# Patient Record
Sex: Female | Born: 1985 | Race: Black or African American | Hispanic: No | Marital: Single
Health system: Southern US, Academic
[De-identification: ages and names within clinical notes are randomized; demographics above are authoritative.]

## PROBLEM LIST (undated history)

## (undated) ENCOUNTER — Encounter: Attending: Obesity Medicine | Primary: Obesity Medicine

## (undated) ENCOUNTER — Ambulatory Visit
Payer: PRIVATE HEALTH INSURANCE | Attending: Student in an Organized Health Care Education/Training Program | Primary: Student in an Organized Health Care Education/Training Program

## (undated) ENCOUNTER — Encounter

## (undated) ENCOUNTER — Ambulatory Visit: Payer: PRIVATE HEALTH INSURANCE

## (undated) ENCOUNTER — Telehealth
Attending: Student in an Organized Health Care Education/Training Program | Primary: Student in an Organized Health Care Education/Training Program

## (undated) ENCOUNTER — Ambulatory Visit

## (undated) ENCOUNTER — Ambulatory Visit: Attending: Clinical | Primary: Clinical

## (undated) ENCOUNTER — Ambulatory Visit: Payer: PRIVATE HEALTH INSURANCE | Attending: Obstetrics & Gynecology | Primary: Obstetrics & Gynecology

## (undated) ENCOUNTER — Ambulatory Visit: Payer: PRIVATE HEALTH INSURANCE | Attending: Family | Primary: Family

## (undated) ENCOUNTER — Ambulatory Visit: Payer: PRIVATE HEALTH INSURANCE | Attending: Obesity Medicine | Primary: Obesity Medicine

## (undated) ENCOUNTER — Encounter
Attending: Student in an Organized Health Care Education/Training Program | Primary: Student in an Organized Health Care Education/Training Program

## (undated) ENCOUNTER — Telehealth

## (undated) ENCOUNTER — Telehealth: Attending: Clinical | Primary: Clinical

## (undated) ENCOUNTER — Encounter: Payer: PRIVATE HEALTH INSURANCE | Attending: Obesity Medicine | Primary: Obesity Medicine

## (undated) ENCOUNTER — Encounter: Attending: Physician Assistant | Primary: Physician Assistant

## (undated) ENCOUNTER — Ambulatory Visit: Payer: BLUE CROSS/BLUE SHIELD

## (undated) ENCOUNTER — Ambulatory Visit: Attending: Obstetrics & Gynecology | Primary: Obstetrics & Gynecology

## (undated) ENCOUNTER — Encounter: Attending: Family | Primary: Family

## (undated) ENCOUNTER — Ambulatory Visit
Attending: Student in an Organized Health Care Education/Training Program | Primary: Student in an Organized Health Care Education/Training Program

## (undated) ENCOUNTER — Ambulatory Visit
Payer: BLUE CROSS/BLUE SHIELD | Attending: Student in an Organized Health Care Education/Training Program | Primary: Student in an Organized Health Care Education/Training Program

## (undated) ENCOUNTER — Telehealth: Attending: Family | Primary: Family

## (undated) ENCOUNTER — Ambulatory Visit: Payer: BLUE CROSS/BLUE SHIELD | Attending: Physician Assistant | Primary: Physician Assistant

## (undated) ENCOUNTER — Encounter: Attending: Obstetrics & Gynecology | Primary: Obstetrics & Gynecology

## (undated) ENCOUNTER — Ambulatory Visit: Payer: PRIVATE HEALTH INSURANCE | Attending: Registered" | Primary: Registered"

## (undated) ENCOUNTER — Ambulatory Visit
Payer: PRIVATE HEALTH INSURANCE | Attending: Rehabilitative and Restorative Service Providers" | Primary: Rehabilitative and Restorative Service Providers"

## (undated) ENCOUNTER — Encounter: Attending: Internal Medicine | Primary: Internal Medicine

## (undated) ENCOUNTER — Encounter: Attending: Clinical | Primary: Clinical

## (undated) ENCOUNTER — Telehealth: Attending: Obstetrics & Gynecology | Primary: Obstetrics & Gynecology

## (undated) ENCOUNTER — Ambulatory Visit: Payer: PRIVATE HEALTH INSURANCE | Attending: Internal Medicine | Primary: Internal Medicine

## (undated) ENCOUNTER — Encounter: Attending: Family Medicine | Primary: Family Medicine

## (undated) ENCOUNTER — Encounter: Attending: Women's Health | Primary: Women's Health

## (undated) ENCOUNTER — Ambulatory Visit: Payer: PRIVATE HEALTH INSURANCE | Attending: Family Medicine | Primary: Family Medicine

## (undated) ENCOUNTER — Ambulatory Visit: Attending: Pharmacist | Primary: Pharmacist

## (undated) ENCOUNTER — Ambulatory Visit: Payer: PRIVATE HEALTH INSURANCE | Attending: Psychiatric/Mental Health | Primary: Psychiatric/Mental Health

---
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
## 2006-03-29 ENCOUNTER — Emergency Department (HOSPITAL_COMMUNITY): Admission: EM | Admit: 2006-03-29 | Discharge: 2006-03-29 | Payer: Self-pay | Admitting: Emergency Medicine

## 2006-06-29 ENCOUNTER — Emergency Department (HOSPITAL_COMMUNITY): Admission: EM | Admit: 2006-06-29 | Discharge: 2006-06-29 | Payer: Self-pay | Admitting: Emergency Medicine

## 2016-12-04 ENCOUNTER — Emergency Department
Admission: EM | Admit: 2016-12-04 | Discharge: 2016-12-04 | Disposition: A | Discharge status: Home / Self Care | Payer: Commercial Managed Care - PPO | Attending: Emergency Medicine | Admitting: Emergency Medicine

## 2016-12-04 ENCOUNTER — Encounter: Payer: Self-pay | Admitting: Emergency Medicine

## 2016-12-04 DIAGNOSIS — K5900 Constipation, unspecified: Secondary | ICD-10-CM

## 2016-12-04 DIAGNOSIS — O9989 Other specified diseases and conditions complicating pregnancy, childbirth and the puerperium: Secondary | ICD-10-CM | POA: Insufficient documentation

## 2016-12-04 DIAGNOSIS — Z3A14 14 weeks gestation of pregnancy: Secondary | ICD-10-CM | POA: Insufficient documentation

## 2016-12-04 NOTE — ED Provider Notes (Signed)
[facility] Emergency Department Provider Note ____________________________________________   I have reviewed the triage vital signs and the triage nursing note.  HISTORY  Chief Complaint Back Pain and Constipation   Historian Patient  HPI Yvonne Frank is a 31 y.o. female G2p3 approx [redacted] weeks pregnant is presenting with complaint of constipation. She states that when she was pertinent with her first child she had constipation so bad in her seventh month that she required rectal disimpaction and wanted to go ahead and come in early before it that becomes a serious issue with this pregnancy. Denies abdominal pain. Denies any vaginal bleeding or vaginal discharge. She states it feels like something is low down in her rectum. She's had no black or bloody stool. She states that she's had no nausea or vomiting. She has been eating apples. She states that she has tried 2 days of taking MiraLAX.  Discomfort is mild-moderate.    History reviewed. No pertinent past medical history.  There are no active problems to display for this patient.   History reviewed. No pertinent surgical history.  Prior to Admission medications   Not on File    No Known Allergies  No family history on file.  Social History Social History  Substance Use Topics  . Smoking status: Never Smoker  . Smokeless tobacco: Not on file  . Alcohol use Not on file    Review of Systems  Constitutional: Negative for fever. Eyes: Negative for visual changes. ENT: Negative for sore throat. Cardiovascular: Negative for chest pain. Respiratory: Negative for shortness of breath. Gastrointestinal: Negative for abdominal pain, vomiting and diarrhea. Genitourinary: Negative for dysuria. Musculoskeletal: Negative for back pain. Skin: Negative for rash. Neurological: Negative for headache.  ____________________________________________   PHYSICAL EXAM:  VITAL SIGNS: ED Triage Vitals   Enc Vitals Group     BP 12/04/16 0922 130/90     Pulse Rate 12/04/16 0922 84     Resp 12/04/16 0922 18     Temp 12/04/16 0922 98.2 F (36.8 C)     Temp Source 12/04/16 0922 Oral     SpO2 12/04/16 0922 97 %     Weight 12/04/16 0923 180 lb (81.6 kg)     Height 12/04/16 0923 5\' 2"  (1.575 m)     Head Circumference --      Peak Flow --      Pain Score 12/04/16 0922 6     Pain Loc --      Pain Edu? --      Excl. in GC? --      Constitutional: Alert and oriented. Well appearing and in no distress. HEENT   Head: Normocephalic and atraumatic.      Eyes: Conjunctivae are normal. Pupils equal and round.       Ears:         Nose: No congestion/rhinnorhea.   Mouth/Throat: Mucous membranes are moist.   Neck: No stridor. Cardiovascular/Chest: Normal rate, regular rhythm.  No murmurs, rubs, or gallops. Respiratory: Normal respiratory effort without tachypnea nor retractions. Breath sounds are clear and equal bilaterally. No wheezes/rales/rhonchi. Gastrointestinal: Soft. No distention, no guarding, no rebound. Nontender.    Genitourinary/rectal: No external hemorrhoids.  Hard stool at fingertip for manual disimpaction evaluation Musculoskeletal: Nontender with normal range of motion in all extremities. No joint effusions.  No lower extremity tenderness.  No edema. Neurologic:  Normal speech and language. No gross or focal neurologic deficits are appreciated. Skin:  Skin is warm, dry and intact. No rash  noted. Psychiatric: Mood and affect are normal. Speech and behavior are normal. Patient exhibits appropriate insight and judgment.   ____________________________________________  LABS (pertinent positives/negatives)  Labs Reviewed - No data to display  ____________________________________________    EKG I, Governor Rooks, MD, the attending physician have personally viewed and interpreted all ECGs.  None ____________________________________________  RADIOLOGY All Xrays were  viewed by me. Imaging interpreted by Radiologist.  None __________________________________________  PROCEDURES  Procedure(s) performed: None  Critical Care performed: None  ____________________________________________   ED COURSE / ASSESSMENT AND PLAN  Pertinent labs & imaging results that were available during my care of the patient were reviewed by me and considered in my medical decision making (see chart for details).    We reviewed adequate hydration, trying docusate rather than MiraLAX, and then also try a suppository at home such as glycerin. On rectal exam here, hard stools at fingertip, unable to really disimpact any volume here in the ED.  No abdominal pain and patient declined pelvic exam which is I think is reasonable given that she's not having any pelvic complaints.    CONSULTATIONS:   None   Patient / Family / Caregiver informed of clinical course, medical decision-making process, and agree with plan.   I discussed return precautions, follow-up instructions, and discharge instructions with patient and/or family.  Discharge Instructions: You were evaluated for constipation related to pregnancy.  Continue to stay well hydrated, increased fiber in your diet, hold off on MiraLAX, take over the counter docusate (Colace) stool softener and you may also try over-the-counter glycerin suppository.  Return to the emergency department immediately for any worsening, patient, black or bloody stools, nausea or vomiting, abdominal pain, or any other symptoms concerning to you.  ___________________________________________   FINAL CLINICAL IMPRESSION(S) / ED DIAGNOSES   Final diagnoses:  Constipation, unspecified constipation type              Note: This dictation was prepared with Dragon dictation. Any transcriptional errors that result from this process are unintentional    Governor Rooks, MD 12/04/16 1218

## 2016-12-04 NOTE — ED Triage Notes (Signed)
Low back pain x 2 days. States is [redacted] weeks pregnant, has not had bowel movement for day, cannot states since when. Denies vag bleed, fluid or contractions, denies abd pain at present.

## 2016-12-04 NOTE — Discharge Instructions (Signed)
You were evaluated for constipation related to pregnancy.  Continue to stay well hydrated, increased fiber in your diet, hold off on MiraLAX, take over the counter docusate (Colace) stool softener and you may also try over-the-counter glycerin suppository.  Return to the emergency department immediately for any worsening, patient, black or bloody stools, nausea or vomiting, abdominal pain, or any other symptoms concerning to you.

## 2016-12-04 NOTE — ED Notes (Signed)
Pt educated on follow up and over the counter medication use. Pt verbalized understanding and was able to ambulate independently without difficulty. NAD noted at time of discharge

## 2016-12-29 ENCOUNTER — Ambulatory Visit
Admission: RE | Admit: 2016-12-29 | Discharge: 2016-12-29 | Disposition: A | Discharge status: Home / Self Care | Payer: Commercial Managed Care - PPO

## 2016-12-29 ENCOUNTER — Ambulatory Visit: Admission: RE | Admit: 2016-12-29 | Discharge: 2016-12-29 | Disposition: A | Discharge status: Home / Self Care

## 2016-12-29 DIAGNOSIS — Z98891 History of uterine scar from previous surgery: Secondary | ICD-10-CM

## 2016-12-29 DIAGNOSIS — O09292 Supervision of pregnancy with other poor reproductive or obstetric history, second trimester: Secondary | ICD-10-CM

## 2016-12-29 DIAGNOSIS — O99212 Obesity complicating pregnancy, second trimester: Secondary | ICD-10-CM

## 2016-12-29 DIAGNOSIS — Z8751 Personal history of pre-term labor: Secondary | ICD-10-CM

## 2016-12-29 DIAGNOSIS — O0942 Supervision of pregnancy with grand multiparity, second trimester: Principal | ICD-10-CM

## 2016-12-29 MED ORDER — ASPIRIN 81 MG TABLET,DELAYED RELEASE
ORAL_TABLET | Freq: Every day | ORAL | 2 refills | 0.00000 days | Status: CP
Start: 2016-12-29 — End: 2017-05-22

## 2017-01-04 ENCOUNTER — Ambulatory Visit: Admit: 2017-01-04 | Discharge: 2017-01-04 | Payer: Commercial Managed Care - PPO

## 2017-01-04 DIAGNOSIS — Z3482 Encounter for supervision of other normal pregnancy, second trimester: Principal | ICD-10-CM

## 2017-01-26 ENCOUNTER — Ambulatory Visit
Admission: RE | Admit: 2017-01-26 | Discharge: 2017-01-26 | Attending: Nurse Practitioner | Admitting: Nurse Practitioner

## 2017-01-26 DIAGNOSIS — Z98891 History of uterine scar from previous surgery: Secondary | ICD-10-CM

## 2017-01-26 DIAGNOSIS — O0942 Supervision of pregnancy with grand multiparity, second trimester: Principal | ICD-10-CM

## 2017-01-26 DIAGNOSIS — O09292 Supervision of pregnancy with other poor reproductive or obstetric history, second trimester: Secondary | ICD-10-CM

## 2017-01-26 DIAGNOSIS — O99212 Obesity complicating pregnancy, second trimester: Secondary | ICD-10-CM

## 2017-02-23 ENCOUNTER — Ambulatory Visit: Admission: RE | Admit: 2017-02-23 | Discharge: 2017-02-23 | Disposition: A | Discharge status: Home / Self Care

## 2017-02-23 DIAGNOSIS — O10919 Unspecified pre-existing hypertension complicating pregnancy, unspecified trimester: Secondary | ICD-10-CM

## 2017-02-23 DIAGNOSIS — O99213 Obesity complicating pregnancy, third trimester: Secondary | ICD-10-CM

## 2017-02-23 DIAGNOSIS — Z98891 History of uterine scar from previous surgery: Secondary | ICD-10-CM

## 2017-02-23 DIAGNOSIS — O0943 Supervision of pregnancy with grand multiparity, third trimester: Principal | ICD-10-CM

## 2017-03-05 ENCOUNTER — Emergency Department
Admission: EM | Admit: 2017-03-05 | Discharge: 2017-03-05 | Disposition: A | Discharge status: Home / Self Care | Payer: Commercial Managed Care - PPO | Attending: Emergency Medicine | Admitting: Emergency Medicine

## 2017-03-05 ENCOUNTER — Encounter: Payer: Self-pay | Admitting: Emergency Medicine

## 2017-03-05 DIAGNOSIS — Z3A00 Weeks of gestation of pregnancy not specified: Secondary | ICD-10-CM | POA: Diagnosis not present

## 2017-03-05 DIAGNOSIS — O99719 Diseases of the skin and subcutaneous tissue complicating pregnancy, unspecified trimester: Secondary | ICD-10-CM | POA: Insufficient documentation

## 2017-03-05 DIAGNOSIS — L739 Follicular disorder, unspecified: Secondary | ICD-10-CM

## 2017-03-05 MED ORDER — PENTAFLUOROPROP-TETRAFLUOROETH EX AERO
INHALATION_SPRAY | Freq: Once | CUTANEOUS | Status: DC
  Filled 2017-03-05: qty 30

## 2017-03-05 MED ORDER — BACITRACIN ZINC 500 UNIT/GM EX OINT
TOPICAL_OINTMENT | CUTANEOUS | Status: AC
  Filled 2017-03-05: qty 0.9

## 2017-03-05 MED ORDER — BACITRACIN ZINC 500 UNIT/GM EX OINT: TOPICAL_OINTMENT | Freq: Two times a day (BID) | CUTANEOUS | Status: DC

## 2017-03-05 NOTE — ED Provider Notes (Signed)
[facility] Emergency Department Provider Note   ____________________________________________   I have reviewed the triage vital signs and the nursing notes.   HISTORY  Chief Complaint Abscess    HPI Yvonne Frank is a 31 y.o. female Presents to the emergency department with small raised bump consistent with inflamed follicle that developed several days ago Patient reported expressing the bump noting a small amount of drainage. Afterwards, she noted increased pain and swelling with mild erythema. Patient is currently pregnant and with her slight weight gain she notes her thighs are rubbing together causing increased irritation along the inflamed follicle. Patient came in to have it evaluated to see if there are any options for alleviating measures. Patient denies any other symptoms and denies any issues or complaints regarding her pregnancy at this time.Patient denies fever, chills, headache, vision changes, chest pain, chest tightness, shortness of breath, abdominal pain, nausea and vomiting.  History reviewed. No pertinent past medical history.  There are no active problems to display for this patient.   Past Surgical History:  Procedure Laterality Date  . CESAREAN SECTION      Prior to Admission medications   Not on File    Allergies Patient has no known allergies.  No family history on file.  Social History Social History  Substance Use Topics  . Smoking status: Never Smoker  . Smokeless tobacco: Never Used  . Alcohol use No    Review of Systems Constitutional: Negative for fever/chills Cardiovascular: Denies chest pain. Respiratory: Denies cough. Denies shortness of breath. Skin: Negative for rash. Painful follicle along the right inner thigh. Neurological: Negative for headaches.  Negative focal weakness or numbness. Negative for loss of consciousness. Able to ambulate. ____________________________________________   PHYSICAL  EXAM:  VITAL SIGNS: ED Triage Vitals  Enc Vitals Group     BP 03/05/17 0823 136/67     Pulse Rate 03/05/17 0823 96     Resp 03/05/17 0823 20     Temp 03/05/17 0823 98.1 F (36.7 C)     Temp Source 03/05/17 0823 Oral     SpO2 03/05/17 0823 98 %     Weight 03/05/17 0824 197 lb (89.4 kg)     Height 03/05/17 0824  (1.575 m)     Head Circumference --      Peak Flow --      Pain Score 03/05/17 0823 6     Pain Loc --      Pain Edu? --      Excl. in GC? --     Constitutional: Alert and oriented. Well appearing and in no acute distress.  Eyes: Conjunctivae are normal. PERRL. EOMI  Head: Normocephalic and atraumatic. Cardiovascular: Normal rate, regular rhythm. Good peripheral circulation. Respiratory: Normal respiratory effort without tachypnea or retractions.  Cardiovascular: Normal rate, regular rhythm. Normal distal pulses. Neurologic: Normal speech and language.  Skin:  Skin is warm, dry and intact. No rash noted. Follicle with erythema and induration without drainage on appearing infected or abscess. Psychiatric: Mood and affect are normal. Speech and behavior are normal. Patient exhibits appropriate insight and judgement.  ____________________________________________   LABS (all labs ordered are listed, but only abnormal results are displayed)  Labs Reviewed - No data to display ____________________________________________  EKG none ____________________________________________  RADIOLOGY none ____________________________________________   PROCEDURES  Procedure(s) performed: no    Critical Care performed: no ____________________________________________   INITIAL IMPRESSION / ASSESSMENT AND PLAN / ED COURSE  Pertinent labs & imaging results that  were available during my care of the patient were reviewed by me and considered in my medical decision making (see chart for details).   Patient presents to emergency department with inflamed follicle along the  right inner thigh. History, physical exam findings are reassuring symptoms areconsistent with inflamed follicle secondary to ingrown hair patient expressed prior to emergency department visit. During visit bacitracin applied and covered with a Band-Aid. Advised patient to continue neosporin for 1 or 2 days and monitor for improvement. Patient has a follow-up appointment with her OB  Provider and plans to have her reassessed on Monday. Reassessment of the patient was reassuring. All options were answered.  Patient advised to follow up with PCP as needed or return to the emergency department if symptoms return or worsen. Patient informed of clinical course, understand medical decision-making process, and agree with plan.  ____________________________________________   FINAL CLINICAL IMPRESSION(S) / ED DIAGNOSES  Final diagnoses:  Folliculitis       NEW MEDICATIONS STARTED DURING THIS VISIT:  There are no discharge medications for this patient.    Note:  This document was prepared using Dragon voice recognition software and may include unintentional dictation errors.    Clois Comber, PA-C 03/05/17 1033    Bonnell Placzek, Jordan Likes, PA-C 03/05/17 1035    Governor Rooks, MD 03/05/17 [phone]

## 2017-03-05 NOTE — Discharge Instructions (Signed)
Applied Neosporin or antibiotic ointment as needed until area resolves. Keep covered with a Band-Aid as needed.  You may take Tylenol as needed for pain as directed on the medication bottle.

## 2017-03-05 NOTE — ED Notes (Signed)
Pt to ed with c/o small red area to right inner thigh since Tuesday.  Pt reports pain radiates up and down leg.

## 2017-03-05 NOTE — ED Triage Notes (Signed)
Patient to ER for abscess to right inner thigh. Patient reports noticing "bump" on Tuesday. Drained blood and pus from area, now area is painful and swollen again. Denies fevers.

## 2017-03-07 ENCOUNTER — Ambulatory Visit
Admission: RE | Admit: 2017-03-07 | Discharge: 2017-03-07 | Disposition: A | Discharge status: Home / Self Care | Payer: Commercial Managed Care - PPO

## 2017-03-07 DIAGNOSIS — Z331 Pregnant state, incidental: Principal | ICD-10-CM

## 2017-03-07 DIAGNOSIS — E7439 Other disorders of intestinal carbohydrate absorption: Secondary | ICD-10-CM

## 2017-03-24 ENCOUNTER — Ambulatory Visit
Admission: RE | Admit: 2017-03-24 | Discharge: 2017-03-24 | Payer: Commercial Managed Care - PPO | Attending: Obstetrics & Gynecology | Admitting: Obstetrics & Gynecology

## 2017-03-24 DIAGNOSIS — O0943 Supervision of pregnancy with grand multiparity, third trimester: Principal | ICD-10-CM

## 2017-03-24 DIAGNOSIS — Z98891 History of uterine scar from previous surgery: Secondary | ICD-10-CM

## 2017-04-05 ENCOUNTER — Ambulatory Visit: Admit: 2017-04-05 | Discharge: 2017-04-05 | Payer: Commercial Managed Care - PPO

## 2017-04-05 DIAGNOSIS — O0943 Supervision of pregnancy with grand multiparity, third trimester: Principal | ICD-10-CM

## 2017-04-14 ENCOUNTER — Ambulatory Visit
Admission: RE | Admit: 2017-04-14 | Discharge: 2017-04-14 | Disposition: A | Discharge status: Home / Self Care | Payer: Commercial Managed Care - PPO | Attending: Nurse Practitioner | Admitting: Nurse Practitioner

## 2017-04-14 DIAGNOSIS — O09293 Supervision of pregnancy with other poor reproductive or obstetric history, third trimester: Secondary | ICD-10-CM

## 2017-04-14 DIAGNOSIS — O0943 Supervision of pregnancy with grand multiparity, third trimester: Principal | ICD-10-CM

## 2017-04-14 DIAGNOSIS — L292 Pruritus vulvae: Secondary | ICD-10-CM

## 2017-04-14 DIAGNOSIS — Z98891 History of uterine scar from previous surgery: Secondary | ICD-10-CM

## 2017-04-19 ENCOUNTER — Ambulatory Visit
Admission: RE | Admit: 2017-04-19 | Discharge: 2017-04-19 | Disposition: A | Discharge status: Home / Self Care | Payer: Commercial Managed Care - PPO

## 2017-04-19 DIAGNOSIS — O133 Gestational [pregnancy-induced] hypertension without significant proteinuria, third trimester: Principal | ICD-10-CM

## 2017-05-02 ENCOUNTER — Ambulatory Visit: Admit: 2017-05-02 | Discharge: 2017-05-02 | Payer: Commercial Managed Care - PPO

## 2017-05-02 DIAGNOSIS — O09299 Supervision of pregnancy with other poor reproductive or obstetric history, unspecified trimester: Principal | ICD-10-CM

## 2017-05-03 ENCOUNTER — Ambulatory Visit
Admission: RE | Admit: 2017-05-03 | Discharge: 2017-05-03 | Disposition: A | Discharge status: Home / Self Care | Payer: Commercial Managed Care - PPO | Attending: Obstetrics & Gynecology | Admitting: Obstetrics & Gynecology

## 2017-05-03 DIAGNOSIS — O133 Gestational [pregnancy-induced] hypertension without significant proteinuria, third trimester: Principal | ICD-10-CM

## 2017-05-03 DIAGNOSIS — O10919 Unspecified pre-existing hypertension complicating pregnancy, unspecified trimester: Secondary | ICD-10-CM

## 2017-05-03 DIAGNOSIS — O09293 Supervision of pregnancy with other poor reproductive or obstetric history, third trimester: Secondary | ICD-10-CM

## 2017-05-03 DIAGNOSIS — Z98891 History of uterine scar from previous surgery: Secondary | ICD-10-CM

## 2017-05-03 DIAGNOSIS — O99213 Obesity complicating pregnancy, third trimester: Secondary | ICD-10-CM

## 2017-05-05 ENCOUNTER — Ambulatory Visit: Admission: RE | Admit: 2017-05-05 | Discharge: 2017-05-05 | Payer: Commercial Managed Care - PPO

## 2017-05-10 ENCOUNTER — Ambulatory Visit: Admission: RE | Admit: 2017-05-10 | Discharge: 2017-05-10

## 2017-05-10 DIAGNOSIS — O10919 Unspecified pre-existing hypertension complicating pregnancy, unspecified trimester: Secondary | ICD-10-CM

## 2017-05-10 DIAGNOSIS — Z98891 History of uterine scar from previous surgery: Secondary | ICD-10-CM

## 2017-05-10 DIAGNOSIS — O0943 Supervision of pregnancy with grand multiparity, third trimester: Principal | ICD-10-CM

## 2017-05-10 MED ORDER — FLUCONAZOLE 150 MG TABLET
ORAL_TABLET | Freq: Once | ORAL | 0 refills | 0.00000 days | Status: CP
Start: 2017-05-10 — End: 2017-05-10

## 2017-05-18 ENCOUNTER — Ambulatory Visit
Admission: RE | Admit: 2017-05-18 | Discharge: 2017-05-18 | Disposition: A | Discharge status: Home / Self Care | Payer: Commercial Managed Care - PPO

## 2017-05-18 DIAGNOSIS — Z3490 Encounter for supervision of normal pregnancy, unspecified, unspecified trimester: Principal | ICD-10-CM

## 2017-05-19 ENCOUNTER — Inpatient Hospital Stay
Admission: RE | Admit: 2017-05-19 | Discharge: 2017-05-22 | Disposition: A | Discharge status: Home / Self Care | Attending: Anesthesiology

## 2017-05-19 ENCOUNTER — Inpatient Hospital Stay
Admission: RE | Admit: 2017-05-19 | Discharge: 2017-05-22 | Disposition: A | Discharge status: Home / Self Care | Payer: Commercial Managed Care - PPO | Attending: Anesthesiology | Admitting: Anesthesiology

## 2017-05-19 ENCOUNTER — Inpatient Hospital Stay
Admission: RE | Admit: 2017-05-19 | Discharge: 2017-05-22 | Disposition: A | Discharge status: Home / Self Care | Payer: Commercial Managed Care - PPO | Attending: Obstetrics & Gynecology | Admitting: Obstetrics & Gynecology

## 2017-05-19 DIAGNOSIS — O34219 Maternal care for unspecified type scar from previous cesarean delivery: Principal | ICD-10-CM

## 2017-05-22 MED ORDER — IBUPROFEN 600 MG TABLET
ORAL_TABLET | Freq: Four times a day (QID) | ORAL | 0 refills | 0 days | Status: CP
Start: 2017-05-22 — End: 2017-07-07

## 2017-05-22 MED ORDER — DOCUSATE SODIUM 100 MG CAPSULE
ORAL_CAPSULE | Freq: Two times a day (BID) | ORAL | 0 refills | 0.00000 days | Status: CP | PRN
Start: 2017-05-22 — End: 2017-06-21

## 2017-05-22 MED ORDER — OXYCODONE 5 MG TABLET
ORAL_TABLET | ORAL | 0 refills | 0 days | Status: CP | PRN
Start: 2017-05-22 — End: 2017-05-27

## 2017-05-22 MED ORDER — ACETAMINOPHEN 325 MG TABLET
ORAL_TABLET | Freq: Four times a day (QID) | ORAL | 0 refills | 0.00000 days | Status: CP
Start: 2017-05-22 — End: 2017-07-07

## 2017-05-27 ENCOUNTER — Emergency Department: Payer: Commercial Managed Care - PPO

## 2017-05-27 ENCOUNTER — Other Ambulatory Visit: Payer: Self-pay

## 2017-05-27 ENCOUNTER — Encounter: Payer: Self-pay | Admitting: Emergency Medicine

## 2017-05-27 ENCOUNTER — Emergency Department
Admission: EM | Admit: 2017-05-27 | Discharge: 2017-05-28 | Disposition: A | Discharge status: Home / Self Care | Payer: Commercial Managed Care - PPO | Attending: Emergency Medicine | Admitting: Emergency Medicine

## 2017-05-27 DIAGNOSIS — K5900 Constipation, unspecified: Secondary | ICD-10-CM | POA: Diagnosis not present

## 2017-05-27 DIAGNOSIS — O9989 Other specified diseases and conditions complicating pregnancy, childbirth and the puerperium: Secondary | ICD-10-CM | POA: Diagnosis present

## 2017-05-27 MED ORDER — MAGNESIUM CITRATE PO SOLN
1.0000 | Freq: Once | ORAL | Status: AC
  Administered 2017-05-28: 1 via ORAL
  Filled 2017-05-27: qty 296

## 2017-05-27 NOTE — ED Triage Notes (Signed)
Pt reports 8 days postpartum had c-section and has not had a BM for the past 8 days denies any abdominal discomfort reports tried to have BM yesterday but had discomfort on her incision. Pt denies any other symptom, pt talks in complete sentences no distress noted

## 2017-05-27 NOTE — ED Provider Notes (Signed)
[facility] Emergency Department Provider Note  ____________________________________________   First MD Initiated Contact with Patient 05/27/17 2253     (approximate)  I have reviewed the triage vital signs and the nursing notes.   HISTORY  Chief Complaint Constipation    HPI Yvonne NoeBianca Frank is a 31 y.o. female complains of being constipated, she had a C-section 8 days ago and had not had a bowel movement 3 days prior for it to be a total of 11 days without a bowel movement, she states she does not have any abdominal pain until she is trying to push to have a bowel movement, the pain is at the incision line, she denies fever chills, she denies vomiting, she tried Colace without any relief ,she has been taking narcotic pain medicine  History reviewed. No pertinent past medical history.  There are no active problems to display for this patient.   Past Surgical History:  Procedure Laterality Date  . CESAREAN SECTION      Prior to Admission medications   Not on File    Allergies Patient has no known allergies.  No family history on file.  Social History Social History   Tobacco Use  . Smoking status: Never Smoker  . Smokeless tobacco: Never Used  Substance Use Topics  . Alcohol use: No  . Drug use: No    Review of Systems  Constitutional: No fever/chills Eyes: No visual changes. ENT: No sore throat. Respiratory: Denies cough ABD: Positive for constipation Genitourinary: Negative for dysuria. Musculoskeletal: Negative for back pain. Skin: Negative for rash.    ____________________________________________   PHYSICAL EXAM:  VITAL SIGNS: ED Triage Vitals  Enc Vitals Group     BP 05/27/17 2228 (!) 160/90     Pulse Rate 05/27/17 2228 (!) 54     Resp 05/27/17 2228 20     Temp 05/27/17 2228 97.9 F (36.6 C)     Temp Source 05/27/17 2228 Oral     SpO2 05/27/17 2228 96 %     Weight 05/27/17 2229 199 lb (90.3 kg)     Height  05/27/17 2229 5\' 2"  (1.575 m)     Head Circumference --      Peak Flow --      Pain Score 05/27/17 2252 0     Pain Loc --      Pain Edu? --      Excl. in GC? --     Constitutional: Alert and oriented. Well appearing and in no acute distress. Eyes: Conjunctivae are normal.  Head: Atraumatic. Nose: No congestion/rhinnorhea. Mouth/Throat: Mucous membranes are moist.   Cardiovascular: Normal rate, regular rhythm.  Heart sounds are normal Respiratory: Normal respiratory effort.  No retractions, lungs are clear to auscultation ABD: Soft, nontender, bowel sounds are present in all 4 quadrants, neurovascular is intact GU: deferred Musculoskeletal: FROM all extremities, warm and well perfused Neurologic:  Normal speech and language.  Skin:  Skin is warm, dry and intact. No rash noted. Psychiatric: Mood and affect are normal. Speech and behavior are normal.  ____________________________________________   LABS (all labs ordered are listed, but only abnormal results are displayed)  Labs Reviewed  POC URINE PREG, ED   ____________________________________________   ____________________________________________  RADIOLOGY  kub shows moderate colonic stool  ____________________________________________   PROCEDURES  Procedure(s) performed: No      ____________________________________________   INITIAL IMPRESSION / ASSESSMENT AND PLAN / ED COURSE  Pertinent labs & imaging results that were available during my care  of the patient were reviewed by me and considered in my medical decision making (see chart for details).  Patient is a 31 year old female who was 8 days postpartum with a C-section, the patient has not had a bowel movement in 11, she is been taking Colace without relief, the pain is only present while she is trying to push out the stool, KUB x-ray was ordered, x-ray called and would like a urine pregnancy test even though she is postpartum by a days they state it is  protocol, point of care urine pregnancy test ordered    ----------------------------------------- 12:08 AM on 05/28/2017 -----------------------------------------  KUB x-ray showed moderate colonic stool, combined with the patient still having bowel sounds, she was sent home with a bottle of magnesium citrate, she was instructed to return if she is unable to have a bowel movement in the next day, she is to add MiraLAX at least 1 tablespoon/day for the next 5 days, but if she is worsening she is to return to the emergency department, patient states she understands and will return if worsening, she was discharged in stable condition Discussed the case with Dr. Don PerkingVeronese, she is comfortable with patient being discharged  ____________________________________________   FINAL CLINICAL IMPRESSION(S) / ED DIAGNOSES  Final diagnoses:  Constipation, unspecified constipation type      NEW MEDICATIONS STARTED DURING THIS VISIT:  This SmartLink is deprecated. Use AVSMEDLIST instead to display the medication list for a patient.   Note:  This document was prepared using Dragon voice recognition software and may include unintentional dictation errors.    Faythe GheeFisher, Susan W, PA-C 05/28/17 0017    Nita SickleVeronese, [place], MD 06/03/17 2103

## 2017-05-28 NOTE — Discharge Instructions (Signed)
Follow-up with your regular doctor if you are not better in 2 days, drink the magnesium citrate when you get home, you will most likely have a bowel movement within the next hour, while you are taking the narcotics you should take MiraLAX as that is the best stool softener available, if you begin to run a fever or you are getting worse please return to the emergency department

## 2017-06-17 ENCOUNTER — Other Ambulatory Visit: Admit: 2017-06-17 | Discharge: 2017-06-18 | Payer: PRIVATE HEALTH INSURANCE

## 2017-06-17 DIAGNOSIS — F419 Anxiety disorder, unspecified: Secondary | ICD-10-CM

## 2017-06-17 DIAGNOSIS — T8149XA Infection following a procedure, other surgical site, initial encounter: Principal | ICD-10-CM

## 2017-06-17 MED ORDER — OXYCODONE 5 MG TABLET
ORAL_TABLET | Freq: Three times a day (TID) | ORAL | 0 refills | 0.00000 days | Status: CP | PRN
Start: 2017-06-17 — End: 2017-06-17

## 2017-06-17 MED ORDER — CEPHALEXIN 500 MG CAPSULE
ORAL_CAPSULE | Freq: Three times a day (TID) | ORAL | 0 refills | 0.00000 days | Status: CP
Start: 2017-06-17 — End: 2017-06-27

## 2017-06-17 MED ORDER — OXYCODONE 5 MG TABLET: 5 mg | tablet | Freq: Three times a day (TID) | 0 refills | 0 days | Status: AC

## 2017-07-07 ENCOUNTER — Other Ambulatory Visit: Admit: 2017-07-07 | Discharge: 2017-07-08 | Payer: PRIVATE HEALTH INSURANCE

## 2017-07-07 DIAGNOSIS — O99345 Other mental disorders complicating the puerperium: Secondary | ICD-10-CM

## 2017-07-07 DIAGNOSIS — Z30011 Encounter for initial prescription of contraceptive pills: Secondary | ICD-10-CM

## 2017-07-07 DIAGNOSIS — F53 Postpartum depression: Secondary | ICD-10-CM

## 2017-07-07 MED ORDER — SERTRALINE 25 MG TABLET
ORAL_TABLET | ORAL | 2 refills | 0.00000 days | Status: CP
Start: 2017-07-07 — End: 2017-08-02

## 2017-07-07 MED ORDER — DROSPIREN-E.ESTRAD-L.MEFOL 3 MG-0.02 MG-0.451 MG(24)/0.451 MG(4)TABLET
ORAL_TABLET | Freq: Every day | ORAL | 4 refills | 0.00000 days | Status: CP
Start: 2017-07-07 — End: 2017-07-08

## 2017-07-08 MED ORDER — NORETHINDRONE 1 MG-ETHINYL ESTRADIOL 20 MCG (21)-IRON 75 MG (7) TABLET
ORAL_TABLET | Freq: Every day | ORAL | 4 refills | 0.00000 days | Status: CP
Start: 2017-07-08 — End: 2018-05-10

## 2017-08-01 ENCOUNTER — Ambulatory Visit
Admit: 2017-08-01 | Discharge: 2017-08-02 | Payer: PRIVATE HEALTH INSURANCE | Attending: Student in an Organized Health Care Education/Training Program | Primary: Student in an Organized Health Care Education/Training Program

## 2017-08-01 DIAGNOSIS — Z6834 Body mass index (BMI) 34.0-34.9, adult: Secondary | ICD-10-CM

## 2017-08-01 DIAGNOSIS — Z0000 Encounter for general adult medical examination without abnormal findings: Secondary | ICD-10-CM

## 2017-08-01 DIAGNOSIS — I10 Essential (primary) hypertension: Secondary | ICD-10-CM

## 2017-08-01 DIAGNOSIS — O99345 Other mental disorders complicating the puerperium: Principal | ICD-10-CM

## 2017-08-01 DIAGNOSIS — F53 Postpartum depression: Secondary | ICD-10-CM

## 2017-08-01 DIAGNOSIS — E6609 Other obesity due to excess calories: Secondary | ICD-10-CM

## 2017-08-17 ENCOUNTER — Ambulatory Visit: Admit: 2017-08-17 | Discharge: 2017-08-18 | Payer: PRIVATE HEALTH INSURANCE | Attending: Family | Primary: Family

## 2017-08-17 DIAGNOSIS — R070 Pain in throat: Principal | ICD-10-CM

## 2017-09-07 ENCOUNTER — Ambulatory Visit
Admission: EM | Admit: 2017-09-07 | Discharge: 2017-09-07 | Disposition: A | Discharge status: Home / Self Care | Payer: Commercial Managed Care - PPO | Attending: Family Medicine | Admitting: Family Medicine

## 2017-09-07 ENCOUNTER — Other Ambulatory Visit: Payer: Self-pay

## 2017-09-07 DIAGNOSIS — J029 Acute pharyngitis, unspecified: Secondary | ICD-10-CM

## 2017-09-07 DIAGNOSIS — J020 Streptococcal pharyngitis: Secondary | ICD-10-CM

## 2017-09-07 LAB — RAPID STREP SCREEN (MED CTR MEBANE ONLY): STREPTOCOCCUS, GROUP A SCREEN (DIRECT): POSITIVE — AB

## 2017-09-07 MED ORDER — PENICILLIN V POTASSIUM 500 MG PO TABS: 500.0000 mg | ORAL_TABLET | Freq: Three times a day (TID) | ORAL | 0 refills | Status: DC

## 2017-09-07 MED ORDER — LIDOCAINE VISCOUS 2 % MT SOLN: OROMUCOSAL | 0 refills | Status: AC

## 2017-09-07 NOTE — ED Triage Notes (Signed)
Patient complains of sore throat that started over 2 weeks ago and was seen by Primary and was told it was viral. Patient reports that she is still having symptoms and no improvement.

## 2017-09-07 NOTE — ED Provider Notes (Signed)
[facility]    CSN: [id number] Arrival date & time: 09/07/17  0810     History   Chief Complaint Chief Complaint  Patient presents with  . Sore Throat    HPI Yvonne Frank is a 32 y.o. female.   The history is provided by the patient.  Sore Throat  This is a new problem. The current episode started more than 1 week ago. The problem occurs constantly. The problem has not changed since onset.Pertinent negatives include no chest pain, no abdominal pain, no headaches and no shortness of breath.    History reviewed. No pertinent past medical history.  There are no active problems to display for this patient.   Past Surgical History:  Procedure Laterality Date  . CESAREAN SECTION      OB History    Gravida  1   Para      Term      Preterm      AB      Living        SAB      TAB      Ectopic      Multiple      Live Births               Home Medications    Prior to Admission medications   Medication Sig Start Date End Date Taking? Authorizing Provider  JUNEL FE 1/20 1-20 MG-MCG tablet  07/08/17   [provider]  lidocaine (XYLOCAINE) 2 % solution 20 ml gargle and spit q 6 hours prn 09/07/17   Payton Mccallumonty, Treyten Monestime, MD  penicillin v potassium (VEETID) 500 MG tablet Take 1 tablet (500 mg total) by mouth 3 (three) times daily. 09/07/17   Payton Mccallumonty, Sherrilyn Nairn, MD    Family History History reviewed. No pertinent family history.  Social History Social History   Tobacco Use  . Smoking status: Never Smoker  . Smokeless tobacco: Never Used  Substance Use Topics  . Alcohol use: Yes    Comment: occasionally  . Drug use: No     Allergies   Patient has no known allergies.   Review of Systems Review of Systems  Respiratory: Negative for shortness of breath.   Cardiovascular: Negative for chest pain.  Gastrointestinal: Negative for abdominal pain.  Neurological: Negative for headaches.     Physical Exam Triage Vital Signs ED  Triage Vitals  Enc Vitals Group     BP 09/07/17 0824 (!) 140/92     Pulse Rate 09/07/17 0824 85     Resp 09/07/17 0824 18     Temp 09/07/17 0824 98 F (36.7 C)     Temp Source 09/07/17 0824 Oral     SpO2 09/07/17 0824 98 %     Weight 09/07/17 0822 180 lb (81.6 kg)     Height 09/07/17 0822 5\' 2"  (1.575 m)     Head Circumference --      Peak Flow --      Pain Score 09/07/17 0822 6     Pain Loc --      Pain Edu? --      Excl. in GC? --    No data found.  Updated Vital Signs BP (!) 140/92 (BP Location: Left Arm)   Pulse 85   Temp 98 F (36.7 C) (Oral)   Resp 18   Ht 5\' 2"  (1.575 m)   Wt 180 lb (81.6 kg)   LMP 09/02/2017   SpO2 98%   Breastfeeding? No  BMI 32.92 kg/m   Visual Acuity Right Eye Distance:   Left Eye Distance:   Bilateral Distance:    Right Eye Near:   Left Eye Near:    Bilateral Near:     Physical Exam  Constitutional: She appears well-developed and well-nourished. No distress.  HENT:  Head: Normocephalic and atraumatic.  Right Ear: Tympanic membrane, external ear and ear canal normal.  Left Ear: Tympanic membrane, external ear and ear canal normal.  Nose: Mucosal edema and rhinorrhea present. No nose lacerations, sinus tenderness, nasal deformity, septal deviation or nasal septal hematoma. No epistaxis.  No foreign bodies. Right sinus exhibits maxillary sinus tenderness and frontal sinus tenderness. Left sinus exhibits maxillary sinus tenderness and frontal sinus tenderness.  Mouth/Throat: Uvula is midline and mucous membranes are normal. Posterior oropharyngeal erythema present. No oropharyngeal exudate, posterior oropharyngeal edema or tonsillar abscesses.  Eyes: Pupils are equal, round, and reactive to light. Conjunctivae and EOM are normal. Right eye exhibits no discharge. Left eye exhibits no discharge. No scleral icterus.  Neck: Normal range of motion. Neck supple. No thyromegaly present.  Cardiovascular: Normal rate, regular rhythm and normal  heart sounds.  Pulmonary/Chest: Effort normal and breath sounds normal. No stridor. No respiratory distress. She has no wheezes. She has no rales.  Lymphadenopathy:    She has no cervical adenopathy.  Skin: She is not diaphoretic.  Nursing note and vitals reviewed.    UC Treatments / Results  Labs (all labs ordered are listed, but only abnormal results are displayed) Labs Reviewed  RAPID STREP SCREEN (NOT AT [facility]) - Abnormal; Notable for the following components:      Result Value   Streptococcus, Group A Screen (Direct) POSITIVE (*)    All other components within normal limits    EKG None Radiology No results found.  Procedures Procedures (including critical care time)  Medications Ordered in UC Medications - No data to display   Initial Impression / Assessment and Plan / UC Course  I have reviewed the triage vital signs and the nursing notes.  Pertinent labs & imaging results that were available during my care of the patient were reviewed by me and considered in my medical decision making (see chart for details).      Final Clinical Impressions(s) / UC Diagnoses   Final diagnoses:  Strep pharyngitis    ED Discharge Orders        Ordered    penicillin v potassium (VEETID) 500 MG tablet  3 times daily     09/07/17 0851    lidocaine (XYLOCAINE) 2 % solution     09/07/17 0852     1. Lab results and diagnosis reviewed with patient 2. rx as per orders above; reviewed possible side effects, interactions, risks and benefits  3. Recommend supportive treatment with  4. Follow-up prn if symptoms worsen or don't improve   Controlled Substance Prescriptions [place] Controlled Substance Registry consulted? Not Applicable   Payton Mccallum, MD 09/07/17 1012

## 2017-09-27 ENCOUNTER — Ambulatory Visit
Admit: 2017-09-27 | Discharge: 2017-09-28 | Payer: PRIVATE HEALTH INSURANCE | Attending: Student in an Organized Health Care Education/Training Program | Primary: Student in an Organized Health Care Education/Training Program

## 2017-09-27 DIAGNOSIS — M542 Cervicalgia: Secondary | ICD-10-CM

## 2017-09-27 DIAGNOSIS — Z862 Personal history of diseases of the blood and blood-forming organs and certain disorders involving the immune mechanism: Secondary | ICD-10-CM

## 2017-09-27 DIAGNOSIS — L659 Nonscarring hair loss, unspecified: Principal | ICD-10-CM

## 2017-10-24 ENCOUNTER — Ambulatory Visit
Admit: 2017-10-24 | Discharge: 2017-10-25 | Payer: PRIVATE HEALTH INSURANCE | Attending: Obstetrics & Gynecology | Primary: Obstetrics & Gynecology

## 2017-10-24 DIAGNOSIS — Z3009 Encounter for other general counseling and advice on contraception: Principal | ICD-10-CM

## 2017-11-17 ENCOUNTER — Ambulatory Visit
Admit: 2017-11-17 | Discharge: 2017-11-18 | Payer: PRIVATE HEALTH INSURANCE | Attending: Community Health | Primary: Community Health

## 2017-11-17 DIAGNOSIS — M62838 Other muscle spasm: Principal | ICD-10-CM

## 2017-11-17 MED ORDER — CYCLOBENZAPRINE 5 MG TABLET: 5 mg | tablet | Freq: Three times a day (TID) | 1 refills | 0 days | Status: AC

## 2017-11-17 MED ORDER — CYCLOBENZAPRINE 5 MG TABLET
ORAL_TABLET | Freq: Three times a day (TID) | ORAL | 1 refills | 0.00000 days | Status: CP
Start: 2017-11-17 — End: 2017-12-17

## 2017-12-26 ENCOUNTER — Ambulatory Visit
Admit: 2017-12-26 | Discharge: 2017-12-26 | Disposition: A | Discharge status: Home / Self Care | Payer: PRIVATE HEALTH INSURANCE

## 2017-12-26 ENCOUNTER — Emergency Department
Admit: 2017-12-26 | Discharge: 2017-12-26 | Disposition: A | Discharge status: Home / Self Care | Payer: PRIVATE HEALTH INSURANCE

## 2017-12-26 DIAGNOSIS — S39012A Strain of muscle, fascia and tendon of lower back, initial encounter: Principal | ICD-10-CM

## 2017-12-26 MED ORDER — IBUPROFEN 800 MG TABLET
ORAL_TABLET | Freq: Three times a day (TID) | ORAL | 0 refills | 0.00000 days | Status: CP | PRN
Start: 2017-12-26 — End: ?

## 2017-12-26 MED ORDER — LIDOCAINE 5 % TOPICAL PATCH
MEDICATED_PATCH | TRANSDERMAL | 0 refills | 0 days | Status: CP
Start: 2017-12-26 — End: 2018-01-10

## 2017-12-27 ENCOUNTER — Other Ambulatory Visit: Payer: Self-pay

## 2017-12-27 ENCOUNTER — Emergency Department: Payer: Commercial Managed Care - PPO

## 2017-12-27 ENCOUNTER — Emergency Department
Admission: EM | Admit: 2017-12-27 | Discharge: 2017-12-27 | Disposition: A | Discharge status: Home / Self Care | Payer: Commercial Managed Care - PPO | Attending: Emergency Medicine | Admitting: Emergency Medicine

## 2017-12-27 ENCOUNTER — Encounter: Payer: Self-pay | Admitting: Emergency Medicine

## 2017-12-27 DIAGNOSIS — Y9389 Activity, other specified: Secondary | ICD-10-CM | POA: Insufficient documentation

## 2017-12-27 DIAGNOSIS — Y999 Unspecified external cause status: Secondary | ICD-10-CM | POA: Diagnosis not present

## 2017-12-27 DIAGNOSIS — S161XXA Strain of muscle, fascia and tendon at neck level, initial encounter: Secondary | ICD-10-CM | POA: Diagnosis not present

## 2017-12-27 DIAGNOSIS — Y92410 Unspecified street and highway as the place of occurrence of the external cause: Secondary | ICD-10-CM | POA: Diagnosis not present

## 2017-12-27 DIAGNOSIS — Z79899 Other long term (current) drug therapy: Secondary | ICD-10-CM | POA: Insufficient documentation

## 2017-12-27 DIAGNOSIS — S199XXA Unspecified injury of neck, initial encounter: Secondary | ICD-10-CM | POA: Diagnosis present

## 2017-12-27 MED ORDER — LIDOCAINE 5 % EX PTCH
1.0000 | MEDICATED_PATCH | CUTANEOUS | Status: DC
  Administered 2017-12-27: 1 via TRANSDERMAL
  Filled 2017-12-27: qty 1

## 2017-12-27 MED ORDER — KETOROLAC TROMETHAMINE 30 MG/ML IJ SOLN
30.0000 mg | Freq: Once | INTRAMUSCULAR | Status: AC
  Administered 2017-12-27: 30 mg via INTRAMUSCULAR
  Filled 2017-12-27: qty 1

## 2017-12-27 MED ORDER — CYCLOBENZAPRINE HCL 5 MG PO TABS: ORAL_TABLET | ORAL | 0 refills | Status: DC
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
## 2018-01-02 ENCOUNTER — Ambulatory Visit: Admit: 2018-01-02 | Discharge: 2018-01-03 | Payer: PRIVATE HEALTH INSURANCE | Attending: Family | Primary: Family

## 2018-01-02 DIAGNOSIS — M545 Low back pain: Secondary | ICD-10-CM

## 2018-01-02 DIAGNOSIS — M542 Cervicalgia: Principal | ICD-10-CM

## 2018-03-06 ENCOUNTER — Encounter
Admit: 2018-03-06 | Discharge: 2018-03-06 | Payer: PRIVATE HEALTH INSURANCE | Attending: Obesity Medicine | Primary: Obesity Medicine

## 2018-03-06 ENCOUNTER — Ambulatory Visit
Admit: 2018-03-06 | Discharge: 2018-03-06 | Payer: PRIVATE HEALTH INSURANCE | Attending: Obesity Medicine | Primary: Obesity Medicine

## 2018-03-06 ENCOUNTER — Ambulatory Visit
Admit: 2018-03-06 | Discharge: 2018-03-06 | Payer: PRIVATE HEALTH INSURANCE | Attending: Registered" | Primary: Registered"

## 2018-03-06 DIAGNOSIS — Z6834 Body mass index (BMI) 34.0-34.9, adult: Secondary | ICD-10-CM

## 2018-03-06 DIAGNOSIS — R5383 Other fatigue: Principal | ICD-10-CM

## 2018-03-06 DIAGNOSIS — E6609 Other obesity due to excess calories: Principal | ICD-10-CM

## 2018-03-06 DIAGNOSIS — I10 Essential (primary) hypertension: Principal | ICD-10-CM

## 2018-03-06 DIAGNOSIS — E669 Obesity, unspecified: Secondary | ICD-10-CM

## 2018-03-10 MED ORDER — ERGOCALCIFEROL (VITAMIN D2) 1,250 MCG (50,000 UNIT) CAPSULE
ORAL_CAPSULE | ORAL | 0 refills | 0.00000 days | Status: CP
Start: 2018-03-10 — End: 2018-05-10

## 2018-03-29 ENCOUNTER — Ambulatory Visit
Admit: 2018-03-29 | Discharge: 2018-03-30 | Payer: PRIVATE HEALTH INSURANCE | Attending: Student in an Organized Health Care Education/Training Program | Primary: Student in an Organized Health Care Education/Training Program

## 2018-03-29 DIAGNOSIS — T148XXA Other injury of unspecified body region, initial encounter: Secondary | ICD-10-CM

## 2018-03-29 DIAGNOSIS — I10 Essential (primary) hypertension: Principal | ICD-10-CM

## 2018-03-29 MED ORDER — LISINOPRIL 20 MG TABLET
ORAL_TABLET | Freq: Every day | ORAL | 11 refills | 0 days | Status: CP
Start: 2018-03-29 — End: 2018-05-10

## 2018-04-05 ENCOUNTER — Ambulatory Visit
Admit: 2018-04-05 | Discharge: 2018-04-06 | Payer: PRIVATE HEALTH INSURANCE | Attending: Registered" | Primary: Registered"

## 2018-04-05 DIAGNOSIS — E669 Obesity, unspecified: Principal | ICD-10-CM

## 2018-04-05 DIAGNOSIS — Z6836 Body mass index (BMI) 36.0-36.9, adult: Secondary | ICD-10-CM

## 2018-04-14 ENCOUNTER — Ambulatory Visit
Admit: 2018-04-14 | Discharge: 2018-04-15 | Payer: PRIVATE HEALTH INSURANCE | Attending: Student in an Organized Health Care Education/Training Program | Primary: Student in an Organized Health Care Education/Training Program

## 2018-04-18 ENCOUNTER — Ambulatory Visit
Admit: 2018-04-18 | Discharge: 2018-04-19 | Payer: PRIVATE HEALTH INSURANCE | Attending: Obstetrics & Gynecology | Primary: Obstetrics & Gynecology

## 2018-04-18 DIAGNOSIS — Z3009 Encounter for other general counseling and advice on contraception: Principal | ICD-10-CM

## 2018-04-25 ENCOUNTER — Ambulatory Visit
Admit: 2018-04-25 | Discharge: 2018-04-26 | Payer: PRIVATE HEALTH INSURANCE | Attending: Obstetrics & Gynecology | Primary: Obstetrics & Gynecology

## 2018-04-25 DIAGNOSIS — Z302 Encounter for sterilization: Principal | ICD-10-CM

## 2018-04-25 DIAGNOSIS — J029 Acute pharyngitis, unspecified: Principal | ICD-10-CM

## 2018-04-26 ENCOUNTER — Ambulatory Visit: Admit: 2018-04-26 | Discharge: 2018-04-26 | Payer: PRIVATE HEALTH INSURANCE

## 2018-04-26 ENCOUNTER — Encounter
Admit: 2018-04-26 | Discharge: 2018-04-26 | Payer: PRIVATE HEALTH INSURANCE | Attending: Certified Registered" | Primary: Certified Registered"

## 2018-04-26 DIAGNOSIS — Z302 Encounter for sterilization: Principal | ICD-10-CM

## 2018-04-26 MED ORDER — ACETAMINOPHEN 325 MG TABLET
ORAL_TABLET | 0 refills | 0 days | Status: CP
Start: 2018-04-26 — End: 2018-05-10

## 2018-04-26 MED ORDER — OXYCODONE 5 MG TABLET
ORAL_TABLET | Freq: Four times a day (QID) | ORAL | 0 refills | 0.00000 days | Status: CP | PRN
Start: 2018-04-26 — End: 2018-05-10

## 2018-04-26 MED ORDER — DOCUSATE SODIUM 100 MG CAPSULE
ORAL_CAPSULE | Freq: Two times a day (BID) | ORAL | 0 refills | 0 days | Status: CP | PRN
Start: 2018-04-26 — End: 2018-05-10

## 2018-04-26 MED ORDER — IBUPROFEN 600 MG TABLET
ORAL_TABLET | 2 refills | 0 days | Status: CP
Start: 2018-04-26 — End: 2018-05-10

## 2018-05-10 ENCOUNTER — Ambulatory Visit
Admit: 2018-05-10 | Discharge: 2018-05-11 | Payer: PRIVATE HEALTH INSURANCE | Attending: Obesity Medicine | Primary: Obesity Medicine

## 2018-05-10 DIAGNOSIS — R5383 Other fatigue: Secondary | ICD-10-CM

## 2018-05-10 DIAGNOSIS — I10 Essential (primary) hypertension: Principal | ICD-10-CM

## 2018-05-10 DIAGNOSIS — E669 Obesity, unspecified: Secondary | ICD-10-CM

## 2018-05-10 MED ORDER — HYDROCHLOROTHIAZIDE 12.5 MG CAPSULE
ORAL_CAPSULE | Freq: Every day | ORAL | 11 refills | 0.00000 days | Status: CP
Start: 2018-05-10 — End: 2018-11-01

## 2018-05-10 MED ORDER — METFORMIN ER 500 MG TABLET,EXTENDED RELEASE 24 HR
ORAL_TABLET | Freq: Every day | ORAL | 3 refills | 0 days | Status: CP
Start: 2018-05-10 — End: 2018-06-15

## 2018-05-10 MED ORDER — LISINOPRIL 20 MG TABLET
ORAL_TABLET | Freq: Every day | ORAL | 11 refills | 0.00000 days | Status: CP
Start: 2018-05-10 — End: 2018-06-15

## 2018-06-01 ENCOUNTER — Other Ambulatory Visit: Payer: Self-pay

## 2018-06-01 ENCOUNTER — Emergency Department
Admission: EM | Admit: 2018-06-01 | Discharge: 2018-06-01 | Discharge status: Against Medical Advice | Payer: Commercial Managed Care - PPO | Attending: Emergency Medicine | Admitting: Emergency Medicine

## 2018-06-01 DIAGNOSIS — Z5321 Procedure and treatment not carried out due to patient leaving prior to being seen by health care provider: Secondary | ICD-10-CM | POA: Diagnosis not present

## 2018-06-01 DIAGNOSIS — T7840XA Allergy, unspecified, initial encounter: Secondary | ICD-10-CM | POA: Diagnosis present

## 2018-06-01 NOTE — ED Triage Notes (Signed)
Pt arrives to ED via POV from home with c/o allergic reaction to an unknown reactant x3 days. Pt reports generalized hives, has been taking Benadryl with intermittent relief (last dose taken 30 mins PTA). Pt denies throat swelling, difficulty breathing, or swallowing. No new lotions, detergents, soaps, or other possible exposures.

## 2018-06-02 ENCOUNTER — Emergency Department
Admission: EM | Admit: 2018-06-02 | Discharge: 2018-06-03 | Disposition: A | Discharge status: Home / Self Care | Payer: Commercial Managed Care - PPO | Attending: Student in an Organized Health Care Education/Training Program | Admitting: Student in an Organized Health Care Education/Training Program

## 2018-06-02 ENCOUNTER — Other Ambulatory Visit: Payer: Self-pay

## 2018-06-02 DIAGNOSIS — T782XXA Anaphylactic shock, unspecified, initial encounter: Secondary | ICD-10-CM

## 2018-06-02 DIAGNOSIS — R0989 Other specified symptoms and signs involving the circulatory and respiratory systems: Secondary | ICD-10-CM | POA: Insufficient documentation

## 2018-06-02 DIAGNOSIS — T7840XA Allergy, unspecified, initial encounter: Secondary | ICD-10-CM | POA: Diagnosis present

## 2018-06-02 DIAGNOSIS — R220 Localized swelling, mass and lump, head: Secondary | ICD-10-CM | POA: Diagnosis not present

## 2018-06-02 LAB — POCT PREGNANCY, URINE: PREG TEST UR: NEGATIVE

## 2018-06-02 LAB — MONONUCLEOSIS SCREEN: Mono Screen: NEGATIVE

## 2018-06-02 LAB — GROUP A STREP BY PCR: Group A Strep by PCR: NOT DETECTED

## 2018-06-02 MED ORDER — EPINEPHRINE 0.3 MG/0.3ML IJ SOAJ
INTRAMUSCULAR | Status: AC
  Administered 2018-06-02: 0.3 mg via INTRAMUSCULAR
  Filled 2018-06-02: qty 0.3

## 2018-06-02 MED ORDER — DIPHENHYDRAMINE HCL 50 MG/ML IJ SOLN
INTRAMUSCULAR | Status: AC
  Filled 2018-06-02: qty 1

## 2018-06-02 MED ORDER — DIPHENHYDRAMINE HCL 25 MG PO CAPS
ORAL_CAPSULE | ORAL | Status: AC
  Filled 2018-06-02: qty 1

## 2018-06-02 MED ORDER — METHYLPREDNISOLONE SODIUM SUCC 125 MG IJ SOLR
125.0000 mg | Freq: Once | INTRAMUSCULAR | Status: AC
Start: 2018-06-02 — End: 2018-06-02
  Administered 2018-06-02: 125 mg via INTRAVENOUS
  Filled 2018-06-02: qty 2

## 2018-06-02 MED ORDER — FAMOTIDINE IN NACL 20-0.9 MG/50ML-% IV SOLN
20.0000 mg | Freq: Once | INTRAVENOUS | Status: AC
  Administered 2018-06-02: 20 mg via INTRAVENOUS
  Filled 2018-06-02: qty 50

## 2018-06-02 MED ORDER — DIPHENHYDRAMINE HCL 50 MG/ML IJ SOLN
12.5000 mg | Freq: Once | INTRAMUSCULAR | Status: AC
Start: 2018-06-02 — End: 2018-06-02
  Administered 2018-06-02: 12.5 mg via INTRAVENOUS
  Filled 2018-06-02: qty 1

## 2018-06-02 MED ORDER — EPINEPHRINE 0.3 MG/0.3ML IJ SOAJ
0.3000 mg | Freq: Once | INTRAMUSCULAR | Status: AC
  Administered 2018-06-02: 0.3 mg via INTRAMUSCULAR

## 2018-06-02 MED ORDER — DIPHENHYDRAMINE HCL 25 MG PO CAPS
25.0000 mg | ORAL_CAPSULE | Freq: Once | ORAL | Status: AC
  Administered 2018-06-02: 25 mg via ORAL
  Filled 2018-06-02: qty 1

## 2018-06-02 NOTE — ED Provider Notes (Signed)
[facility] Emergency Department Provider Note    First MD Initiated Contact with Patient 06/02/18 2029     (approximate)  I have reviewed the triage vital signs and the nursing notes.   HISTORY  Chief Complaint Allergic Reaction    HPI Lindell NoeBianca Lingard is a 32 y.o. female presents the ER with diffuse urticaria feeling of needing to clear her throat eye swelling and lip swelling.  States that the symptoms have been happening nightly for the past 4 nights.  States that she will take a Benadryl and they will resolve but tonight is the worst that she has had it.  Her daughter was recently sick with the flu and strep throat.  She is not been on any new medications or antibiotics.  No nausea or vomiting.    No past medical history on file. No family history on file. Past Surgical History:  Procedure Laterality Date  . CESAREAN SECTION     There are no active problems to display for this patient.     Prior to Admission medications   Medication Sig Start Date End Date Taking? Authorizing Provider  cyclobenzaprine (FLEXERIL) 5 MG tablet Take 1-2 tablets 3 times daily as needed 12/27/17   Enid DerryWagner, Ashley, PA-C  EPINEPHrine 0.3 mg/0.3 mL IJ SOAJ injection Inject 0.3 mLs (0.3 mg total) into the muscle once for 1 dose. 06/03/18 06/03/18  Willy Eddyobinson, Kurt Hoffmeier, MD  JUNEL FE 1/20 1-20 MG-MCG tablet  07/08/17   [provider]  lidocaine (XYLOCAINE) 2 % solution 20 ml gargle and spit q 6 hours prn 09/07/17   Payton Mccallumonty, [place], MD  penicillin v potassium (VEETID) 500 MG tablet Take 1 tablet (500 mg total) by mouth 3 (three) times daily. 09/07/17   Payton Mccallumonty, [place], MD  predniSONE (DELTASONE) 10 MG tablet Take 1 tablet (10 mg total) by mouth daily. Day 1-2: Take 50 mg  ( 5 pills) Day 3-4 : Take 40 mg (4pills) Day 5-6: Take 30 mg (3 pills) Day 7-8:  Take 20 mg (2 pills) Day 9:  Take 10mg  (1 pill) 06/03/18   Willy Eddyobinson, Gordy Goar, MD    Allergies Patient has no known  allergies.    Social History Social History   Tobacco Use  . Smoking status: Never Smoker  . Smokeless tobacco: Never Used  Substance Use Topics  . Alcohol use: Yes    Comment: occasionally  . Drug use: No    Review of Systems Patient denies headaches, rhinorrhea, blurry vision, numbness, shortness of breath, chest pain, edema, cough, abdominal pain, nausea, vomiting, diarrhea, dysuria, fevers, rashes or hallucinations unless otherwise stated above in HPI. ____________________________________________   PHYSICAL EXAM:  VITAL SIGNS: Vitals:   06/02/18 2130 06/02/18 2236  BP: 125/77 (!) 146/89  Pulse: 80 83  Resp: 18 18  Temp:    SpO2: 98% 99%    Constitutional: Alert and oriented.  Eyes: Conjunctivae are normal. No scleral icterus Head: Atraumatic. Nose: No congestion/rhinnorhea. Mouth/Throat: Mucous membranes are moist.  Uvula edema but no tongue swelling.  Does have bilateral tonsillar exudates without erythema.  Uvula is midline. Neck: No stridor. Painless ROM.  Cardiovascular: Normal rate, regular rhythm. Grossly normal heart sounds.  Good peripheral circulation. Respiratory: Normal respiratory effort.  No retractions. Lungs with occasional faint wheeze. Gastrointestinal: Soft and nontender. No distention. No abdominal bruits. No CVA tenderness. Genitourinary:  Musculoskeletal: No lower extremity tenderness nor edema.  No joint effusions. Neurologic:  Normal speech and language. No gross focal neurologic deficits are appreciated.  No facial droop Skin: Diffuse urticaria on entire torso abdomen and back Psychiatric: Mood and affect are normal. Speech and behavior are normal.  ____________________________________________   LABS (all labs ordered are listed, but only abnormal results are displayed)  Results for orders placed or performed during the hospital encounter of 06/02/18 (from the past 24 hour(s))  Group A Strep by PCR ([facility] Only)     Status: None    Collection Time: 06/02/18  8:39 PM  Result Value Ref Range   Group A Strep by PCR NOT DETECTED NOT DETECTED  Mononucleosis screen     Status: None   Collection Time: 06/02/18  8:39 PM  Result Value Ref Range   Mono Screen NEGATIVE NEGATIVE  Pregnancy, urine POC     Status: None   Collection Time: 06/02/18  9:54 PM  Result Value Ref Range   Preg Test, Ur NEGATIVE NEGATIVE   ____________________________________________ ____________________________________________  RADIOLOGY   ____________________________________________   PROCEDURES  Procedure(s) performed:  Procedures    Critical Care performed: no ____________________________________________   INITIAL IMPRESSION / ASSESSMENT AND PLAN / ED COURSE  Pertinent labs & imaging results that were available during my care of the patient were reviewed by me and considered in my medical decision making (see chart for details).   DDX: anaphylaxis, anaphylactoid, urticaria, viral illness, strep, mono   Lindell NoeBianca Totton is a 32 y.o. who presents to the ED with anaphylactic reaction.  Currently protecting her airway.  Given emergent epinephrine will obtain IV as well as give steroids H1 and H2 blocker.  Abdominal exam soft benign.  Uncertain etiology possible viral given recent sick contacts at home.  No new medications.  Possible food allergy.  Will observe in the ER.  Clinical Course as of Jun 03 22  Fri Jun 02, 2018  2115 Patient reassessed with significant improvement symptoms.  Now she is complaining of some sore throat but no uvular edema.  Will follow up on strep as well as mono.   [PR]  Sat Jun 03, 2018  0017 Patient reassessed remains nontoxic.  Still having some mild itching in her palms that she states she has been having for several days but no urticaria.  Symptoms are mild and tolerable at this point.  No urticaria.  No uvular swelling.  No wheezing.  This point believe patient stable and appropriate for outpatient  follow-up.   [PR]    Clinical Course User Index [PR] Willy Eddyobinson, Ryn Peine, MD     As part of my medical decision making, I reviewed the following data within the electronic MEDICAL RECORD NUMBER Nursing notes reviewed and incorporated, Labs reviewed, notes from prior ED visits.  ____________________________________________   FINAL CLINICAL IMPRESSION(S) / ED DIAGNOSES  Final diagnoses:  Anaphylaxis, initial encounter      NEW MEDICATIONS STARTED DURING THIS VISIT:  New Prescriptions   EPINEPHRINE 0.3 MG/0.3 ML IJ SOAJ INJECTION    Inject 0.3 mLs (0.3 mg total) into the muscle once for 1 dose.   PREDNISONE (DELTASONE) 10 MG TABLET    Take 1 tablet (10 mg total) by mouth daily. Day 1-2: Take 50 mg  ( 5 pills) Day 3-4 : Take 40 mg (4pills) Day 5-6: Take 30 mg (3 pills) Day 7-8:  Take 20 mg (2 pills) Day 9:  Take 10mg  (1 pill)     Note:  This document was prepared using Dragon voice recognition software and may include unintentional dictation errors.    Willy Eddyobinson, Jacy Howat, MD 06/03/18 [id number]

## 2018-06-02 NOTE — ED Triage Notes (Signed)
Pt in with co allergic reaction x 4 days, states at night. Unsure of cause, was here last night for the same but left prior to being seen. Pt with swelling to eyes and redness to face. Pt keeps clearing throat in triage, states feels like throat and mouth is swelling.

## 2018-06-03 MED ORDER — EPINEPHRINE 0.3 MG/0.3ML IJ SOAJ: 0.3000 mg | Freq: Once | INTRAMUSCULAR | 0 refills | Status: AC

## 2018-06-03 MED ORDER — PREDNISONE 10 MG PO TABS: 10.0000 mg | ORAL_TABLET | Freq: Every day | ORAL | 0 refills | Status: DC

## 2018-06-03 NOTE — ED Notes (Signed)
Patient verbalized understanding of discharge instructions, no questions. Patient ambulated out of ED with steady gait in no distress.  

## 2018-06-15 ENCOUNTER — Ambulatory Visit
Admit: 2018-06-15 | Discharge: 2018-06-16 | Payer: PRIVATE HEALTH INSURANCE | Attending: Physician Assistant | Primary: Physician Assistant

## 2018-06-15 DIAGNOSIS — Z6834 Body mass index (BMI) 34.0-34.9, adult: Secondary | ICD-10-CM

## 2018-06-15 DIAGNOSIS — E6609 Other obesity due to excess calories: Principal | ICD-10-CM

## 2018-06-15 MED ORDER — TOPIRAMATE 25 MG TABLET
ORAL_TABLET | Freq: Every evening | ORAL | 2 refills | 0.00000 days | Status: CP
Start: 2018-06-15 — End: 2018-07-27

## 2018-06-20 ENCOUNTER — Ambulatory Visit
Admit: 2018-06-20 | Discharge: 2018-06-21 | Payer: PRIVATE HEALTH INSURANCE | Attending: Student in an Organized Health Care Education/Training Program | Primary: Student in an Organized Health Care Education/Training Program

## 2018-06-20 DIAGNOSIS — I10 Essential (primary) hypertension: Principal | ICD-10-CM

## 2018-06-20 DIAGNOSIS — G43009 Migraine without aura, not intractable, without status migrainosus: Secondary | ICD-10-CM

## 2018-07-27 ENCOUNTER — Ambulatory Visit
Admit: 2018-07-27 | Discharge: 2018-07-28 | Payer: PRIVATE HEALTH INSURANCE | Attending: Physician Assistant | Primary: Physician Assistant

## 2018-07-27 DIAGNOSIS — E6609 Other obesity due to excess calories: Principal | ICD-10-CM

## 2018-07-27 DIAGNOSIS — Z6834 Body mass index (BMI) 34.0-34.9, adult: Secondary | ICD-10-CM

## 2018-07-27 MED ORDER — TOPIRAMATE 25 MG TABLET
ORAL_TABLET | Freq: Every evening | ORAL | 2 refills | 0 days | Status: CP
Start: 2018-07-27 — End: 2018-09-06

## 2018-08-28 ENCOUNTER — Institutional Professional Consult (permissible substitution)
Admit: 2018-08-28 | Discharge: 2018-08-29 | Payer: PRIVATE HEALTH INSURANCE | Attending: Obesity Medicine | Primary: Obesity Medicine

## 2018-08-28 DIAGNOSIS — I10 Essential (primary) hypertension: Principal | ICD-10-CM

## 2018-08-28 DIAGNOSIS — E669 Obesity, unspecified: Principal | ICD-10-CM

## 2018-08-28 DIAGNOSIS — G43009 Migraine without aura, not intractable, without status migrainosus: Principal | ICD-10-CM

## 2018-09-06 MED ORDER — TOPIRAMATE 25 MG TABLET
ORAL_TABLET | Freq: Every evening | ORAL | 2 refills | 0 days | Status: CP
Start: 2018-09-06 — End: 2019-01-25

## 2018-10-05 ENCOUNTER — Telehealth
Admit: 2018-10-05 | Discharge: 2018-10-05 | Payer: PRIVATE HEALTH INSURANCE | Attending: Internal Medicine | Primary: Internal Medicine

## 2018-10-05 ENCOUNTER — Ambulatory Visit: Admit: 2018-10-05 | Discharge: 2018-10-05 | Payer: PRIVATE HEALTH INSURANCE

## 2018-10-05 DIAGNOSIS — L509 Urticaria, unspecified: Principal | ICD-10-CM

## 2018-10-05 DIAGNOSIS — Z91013 Allergy to seafood: Secondary | ICD-10-CM

## 2018-10-05 MED ORDER — EPINEPHRINE 0.3 MG/0.3 ML INJECTION, AUTO-INJECTOR
Freq: Once | INTRAMUSCULAR | 2 refills | 0.00000 days | Status: CP
Start: 2018-10-05 — End: 2018-10-05

## 2018-11-01 ENCOUNTER — Institutional Professional Consult (permissible substitution)
Admit: 2018-11-01 | Discharge: 2018-11-01 | Payer: PRIVATE HEALTH INSURANCE | Attending: Student in an Organized Health Care Education/Training Program | Primary: Student in an Organized Health Care Education/Training Program

## 2018-11-01 ENCOUNTER — Institutional Professional Consult (permissible substitution)
Admit: 2018-11-01 | Discharge: 2018-11-01 | Payer: PRIVATE HEALTH INSURANCE | Attending: Obesity Medicine | Primary: Obesity Medicine

## 2018-11-01 DIAGNOSIS — I10 Essential (primary) hypertension: Principal | ICD-10-CM

## 2018-11-01 MED ORDER — HYDROCHLOROTHIAZIDE 25 MG TABLET
ORAL_TABLET | Freq: Every day | ORAL | 11 refills | 0.00000 days | Status: CP
Start: 2018-11-01 — End: 2019-11-01

## 2018-11-02 DIAGNOSIS — I10 Essential (primary) hypertension: Secondary | ICD-10-CM

## 2018-11-02 DIAGNOSIS — G43009 Migraine without aura, not intractable, without status migrainosus: Secondary | ICD-10-CM

## 2018-11-02 DIAGNOSIS — E669 Obesity, unspecified: Principal | ICD-10-CM

## 2018-11-10 ENCOUNTER — Ambulatory Visit
Admit: 2018-11-10 | Discharge: 2018-11-11 | Payer: PRIVATE HEALTH INSURANCE | Attending: Student in an Organized Health Care Education/Training Program | Primary: Student in an Organized Health Care Education/Training Program

## 2018-11-10 DIAGNOSIS — I10 Essential (primary) hypertension: Principal | ICD-10-CM

## 2018-11-10 MED ORDER — LISINOPRIL 10 MG TABLET
ORAL_TABLET | Freq: Every day | ORAL | 11 refills | 0 days | Status: CP
Start: 2018-11-10 — End: 2019-11-10

## 2018-11-20 ENCOUNTER — Telehealth: Admit: 2018-11-20 | Discharge: 2018-11-21 | Payer: PRIVATE HEALTH INSURANCE

## 2018-11-20 DIAGNOSIS — R0789 Other chest pain: Principal | ICD-10-CM
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
## 2018-12-04 ENCOUNTER — Institutional Professional Consult (permissible substitution)
Admit: 2018-12-04 | Discharge: 2018-12-05 | Payer: PRIVATE HEALTH INSURANCE | Attending: Obesity Medicine | Primary: Obesity Medicine

## 2018-12-04 DIAGNOSIS — G43009 Migraine without aura, not intractable, without status migrainosus: Secondary | ICD-10-CM

## 2018-12-04 DIAGNOSIS — R5383 Other fatigue: Secondary | ICD-10-CM

## 2018-12-04 DIAGNOSIS — I10 Essential (primary) hypertension: Secondary | ICD-10-CM

## 2018-12-04 DIAGNOSIS — E669 Obesity, unspecified: Principal | ICD-10-CM

## 2018-12-05 MED ORDER — PHENTERMINE 37.5 MG TABLET
ORAL_TABLET | Freq: Every morning | ORAL | 3 refills | 0 days | Status: CP
Start: 2018-12-05 — End: ?

## 2019-01-03 ENCOUNTER — Telehealth
Admit: 2019-01-03 | Discharge: 2019-01-04 | Payer: PRIVATE HEALTH INSURANCE | Attending: Obesity Medicine | Primary: Obesity Medicine

## 2019-01-25 MED ORDER — TOPIRAMATE 25 MG TABLET
ORAL_TABLET | Freq: Every evening | ORAL | 2 refills | 30.00000 days | Status: CP
Start: 2019-01-25 — End: ?

## 2019-02-16 ENCOUNTER — Ambulatory Visit: Admit: 2019-02-16 | Discharge: 2019-02-17 | Payer: PRIVATE HEALTH INSURANCE

## 2019-02-16 DIAGNOSIS — M25561 Pain in right knee: Secondary | ICD-10-CM

## 2019-02-20 DIAGNOSIS — M25561 Pain in right knee: Secondary | ICD-10-CM

## 2019-02-22 DIAGNOSIS — M25561 Pain in right knee: Secondary | ICD-10-CM

## 2019-02-23 DIAGNOSIS — M25561 Pain in right knee: Secondary | ICD-10-CM

## 2019-02-26 ENCOUNTER — Ambulatory Visit: Admit: 2019-02-26 | Discharge: 2019-02-27 | Payer: PRIVATE HEALTH INSURANCE

## 2019-02-26 DIAGNOSIS — M25561 Pain in right knee: Secondary | ICD-10-CM

## 2019-02-26 DIAGNOSIS — S86811A Strain of other muscle(s) and tendon(s) at lower leg level, right leg, initial encounter: Secondary | ICD-10-CM

## 2019-02-26 DIAGNOSIS — R600 Localized edema: Secondary | ICD-10-CM

## 2019-02-26 DIAGNOSIS — M948X6 Other specified disorders of cartilage, lower leg: Secondary | ICD-10-CM

## 2019-02-28 ENCOUNTER — Ambulatory Visit: Admit: 2019-02-28 | Discharge: 2019-03-01 | Payer: PRIVATE HEALTH INSURANCE

## 2019-02-28 DIAGNOSIS — M222X1 Patellofemoral disorders, right knee: Secondary | ICD-10-CM

## 2019-03-12 ENCOUNTER — Telehealth
Admit: 2019-03-12 | Discharge: 2019-03-13 | Payer: PRIVATE HEALTH INSURANCE | Attending: Obesity Medicine | Primary: Obesity Medicine

## 2019-03-12 DIAGNOSIS — E669 Obesity, unspecified: Secondary | ICD-10-CM

## 2019-03-12 DIAGNOSIS — I10 Essential (primary) hypertension: Secondary | ICD-10-CM

## 2019-03-12 DIAGNOSIS — G43009 Migraine without aura, not intractable, without status migrainosus: Secondary | ICD-10-CM

## 2019-05-01 ENCOUNTER — Ambulatory Visit: Admit: 2019-05-01 | Discharge: 2019-05-02 | Payer: PRIVATE HEALTH INSURANCE | Attending: Family | Primary: Family

## 2019-05-08 ENCOUNTER — Telehealth
Admit: 2019-05-08 | Discharge: 2019-05-09 | Payer: PRIVATE HEALTH INSURANCE | Attending: Family Medicine | Primary: Family Medicine

## 2019-05-08 DIAGNOSIS — R238 Other skin changes: Principal | ICD-10-CM

## 2019-05-08 DIAGNOSIS — R42 Dizziness and giddiness: Principal | ICD-10-CM

## 2019-05-08 MED ORDER — PHENTERMINE 37.5 MG TABLET
ORAL_TABLET | Freq: Every morning | ORAL | 3 refills | 30.00000 days | Status: CP
Start: 2019-05-08 — End: ?

## 2019-05-16 MED ORDER — TOPIRAMATE 25 MG TABLET
ORAL_TABLET | Freq: Every evening | ORAL | 2 refills | 30.00000 days | Status: CP
Start: 2019-05-16 — End: ?

## 2019-05-20 ENCOUNTER — Encounter: Admit: 2019-05-20 | Discharge: 2019-05-20

## 2019-05-20 DIAGNOSIS — R69 Illness, unspecified: Principal | ICD-10-CM

## 2019-05-28 ENCOUNTER — Ambulatory Visit: Admit: 2019-05-28 | Discharge: 2019-05-28 | Payer: PRIVATE HEALTH INSURANCE | Attending: Family | Primary: Family

## 2019-05-28 ENCOUNTER — Telehealth
Admit: 2019-05-28 | Discharge: 2019-05-28 | Payer: PRIVATE HEALTH INSURANCE | Attending: Obesity Medicine | Primary: Obesity Medicine

## 2019-05-28 DIAGNOSIS — E669 Obesity, unspecified: Principal | ICD-10-CM

## 2019-05-28 DIAGNOSIS — G43009 Migraine without aura, not intractable, without status migrainosus: Principal | ICD-10-CM

## 2019-05-28 DIAGNOSIS — I10 Essential (primary) hypertension: Principal | ICD-10-CM

## 2019-05-29 ENCOUNTER — Institutional Professional Consult (permissible substitution)
Admit: 2019-05-29 | Discharge: 2019-05-30 | Payer: PRIVATE HEALTH INSURANCE | Attending: Family Medicine | Primary: Family Medicine

## 2019-06-04 ENCOUNTER — Institutional Professional Consult (permissible substitution)
Admit: 2019-06-04 | Discharge: 2019-06-05 | Payer: PRIVATE HEALTH INSURANCE | Attending: Family Medicine | Primary: Family Medicine

## 2019-07-02 ENCOUNTER — Telehealth
Admit: 2019-07-02 | Discharge: 2019-07-02 | Payer: PRIVATE HEALTH INSURANCE | Attending: Student in an Organized Health Care Education/Training Program | Primary: Student in an Organized Health Care Education/Training Program

## 2019-07-02 DIAGNOSIS — R0982 Postnasal drip: Principal | ICD-10-CM

## 2019-09-20 ENCOUNTER — Ambulatory Visit
Admit: 2019-09-20 | Discharge: 2019-09-21 | Payer: PRIVATE HEALTH INSURANCE | Attending: Student in an Organized Health Care Education/Training Program | Primary: Student in an Organized Health Care Education/Training Program

## 2019-09-20 MED ORDER — LISINOPRIL 10 MG TABLET
ORAL_TABLET | Freq: Every day | ORAL | 3 refills | 90 days | Status: CP
Start: 2019-09-20 — End: 2020-09-19

## 2019-09-20 MED ORDER — HYDROCHLOROTHIAZIDE 25 MG TABLET
ORAL_TABLET | Freq: Every day | ORAL | 3 refills | 90.00000 days | Status: CP
Start: 2019-09-20 — End: 2020-09-19

## 2019-10-30 MED ORDER — TOPIRAMATE 25 MG TABLET
ORAL_TABLET | Freq: Every evening | ORAL | 2 refills | 30 days | Status: CP
Start: 2019-10-30 — End: ?

## 2019-11-06 ENCOUNTER — Ambulatory Visit
Admit: 2019-11-06 | Payer: PRIVATE HEALTH INSURANCE | Attending: Rehabilitative and Restorative Service Providers" | Primary: Rehabilitative and Restorative Service Providers"

## 2019-11-14 MED ORDER — PHENTERMINE 37.5 MG TABLET
ORAL_TABLET | 0 refills | 0 days | Status: CP
Start: 2019-11-14 — End: ?

## 2019-11-19 ENCOUNTER — Telehealth
Admit: 2019-11-19 | Discharge: 2019-11-20 | Payer: PRIVATE HEALTH INSURANCE | Attending: Family Medicine | Primary: Family Medicine

## 2019-11-19 DIAGNOSIS — R0789 Other chest pain: Principal | ICD-10-CM

## 2019-11-19 DIAGNOSIS — R05 Cough: Principal | ICD-10-CM

## 2019-12-06 ENCOUNTER — Ambulatory Visit
Admit: 2019-12-06 | Discharge: 2019-12-07 | Payer: PRIVATE HEALTH INSURANCE | Attending: Obesity Medicine | Primary: Obesity Medicine

## 2019-12-06 DIAGNOSIS — E6609 Other obesity due to excess calories: Principal | ICD-10-CM

## 2019-12-06 DIAGNOSIS — M533 Sacrococcygeal disorders, not elsewhere classified: Principal | ICD-10-CM

## 2019-12-06 DIAGNOSIS — Z6830 Body mass index (BMI) 30.0-30.9, adult: Principal | ICD-10-CM

## 2019-12-06 DIAGNOSIS — G43009 Migraine without aura, not intractable, without status migrainosus: Principal | ICD-10-CM

## 2019-12-06 DIAGNOSIS — I10 Essential (primary) hypertension: Principal | ICD-10-CM

## 2019-12-06 DIAGNOSIS — F53 Postpartum depression: Secondary | ICD-10-CM

## 2019-12-06 DIAGNOSIS — O99345 Other mental disorders complicating the puerperium: Secondary | ICD-10-CM

## 2019-12-06 MED ORDER — WEGOVY 0.25 MG/0.5 ML SUBCUTANEOUS PEN INJECTOR
SUBCUTANEOUS | 0 refills | 0.00000 days | Status: CP
Start: 2019-12-06 — End: ?

## 2019-12-31 ENCOUNTER — Ambulatory Visit: Admit: 2019-12-31 | Discharge: 2020-01-01 | Payer: PRIVATE HEALTH INSURANCE

## 2019-12-31 MED ORDER — ALBUTEROL SULFATE HFA 90 MCG/ACTUATION AEROSOL INHALER
RESPIRATORY_TRACT | 0 refills | 0 days | Status: CP | PRN
Start: 2019-12-31 — End: 2020-12-30

## 2019-12-31 MED ORDER — BENZONATATE 100 MG CAPSULE
ORAL_CAPSULE | Freq: Three times a day (TID) | ORAL | 1 refills | 10 days | Status: CP | PRN
Start: 2019-12-31 — End: 2020-12-30

## 2020-01-10 MED ORDER — PHENTERMINE 37.5 MG TABLET
ORAL_TABLET | 0 refills | 0.00000 days | Status: CP
Start: 2020-01-10 — End: ?

## 2020-02-04 ENCOUNTER — Ambulatory Visit: Admit: 2020-02-04 | Discharge: 2020-02-05 | Payer: PRIVATE HEALTH INSURANCE

## 2020-02-04 MED ORDER — SULFAMETHOXAZOLE 800 MG-TRIMETHOPRIM 160 MG TABLET
ORAL_TABLET | Freq: Two times a day (BID) | ORAL | 0 refills | 5 days | Status: CP
Start: 2020-02-04 — End: 2020-02-09

## 2020-02-13 ENCOUNTER — Ambulatory Visit: Admit: 2020-02-13 | Payer: PRIVATE HEALTH INSURANCE | Attending: Obesity Medicine | Primary: Obesity Medicine

## 2020-02-18 ENCOUNTER — Ambulatory Visit
Admit: 2020-02-18 | Discharge: 2020-02-19 | Payer: PRIVATE HEALTH INSURANCE | Attending: Obesity Medicine | Primary: Obesity Medicine

## 2020-02-18 DIAGNOSIS — E6609 Other obesity due to excess calories: Principal | ICD-10-CM

## 2020-02-18 DIAGNOSIS — M533 Sacrococcygeal disorders, not elsewhere classified: Principal | ICD-10-CM

## 2020-02-18 DIAGNOSIS — Z6830 Body mass index (BMI) 30.0-30.9, adult: Principal | ICD-10-CM

## 2020-02-18 DIAGNOSIS — G43009 Migraine without aura, not intractable, without status migrainosus: Principal | ICD-10-CM

## 2020-02-18 DIAGNOSIS — I10 Essential (primary) hypertension: Principal | ICD-10-CM

## 2020-02-18 MED ORDER — PHENTERMINE 37.5 MG TABLET
ORAL_TABLET | 5 refills | 0 days | Status: CP
Start: 2020-02-18 — End: ?

## 2020-02-18 MED ORDER — WEGOVY 0.5 MG/0.5 ML SUBCUTANEOUS PEN INJECTOR
SUBCUTANEOUS | 0 refills | 0.00000 days | Status: CP
Start: 2020-02-18 — End: 2020-03-11

## 2020-02-28 ENCOUNTER — Ambulatory Visit: Admit: 2020-02-28 | Discharge: 2020-02-29 | Payer: PRIVATE HEALTH INSURANCE

## 2020-03-19 ENCOUNTER — Ambulatory Visit: Admit: 2020-03-19 | Payer: PRIVATE HEALTH INSURANCE

## 2020-05-20 ENCOUNTER — Ambulatory Visit: Admit: 2020-05-20 | Discharge: 2020-05-21 | Payer: PRIVATE HEALTH INSURANCE

## 2020-05-20 ENCOUNTER — Ambulatory Visit: Admit: 2020-05-20 | Discharge: 2020-05-21 | Payer: PRIVATE HEALTH INSURANCE | Attending: Family | Primary: Family

## 2020-05-20 DIAGNOSIS — M5442 Lumbago with sciatica, left side: Principal | ICD-10-CM

## 2020-05-20 DIAGNOSIS — M5136 Other intervertebral disc degeneration, lumbar region: Principal | ICD-10-CM

## 2020-05-20 MED ORDER — CYCLOBENZAPRINE 10 MG TABLET
ORAL_TABLET | Freq: Three times a day (TID) | ORAL | 0 refills | 10 days | Status: CP
Start: 2020-05-20 — End: 2020-06-04

## 2020-05-26 MED ORDER — TOPIRAMATE 25 MG TABLET
ORAL_TABLET | 0 refills | 0 days | Status: CP
Start: 2020-05-26 — End: ?

## 2020-06-05 MED ORDER — CYCLOBENZAPRINE 10 MG TABLET
ORAL_TABLET | Freq: Three times a day (TID) | ORAL | 0 refills | 10 days | Status: CP
Start: 2020-06-05 — End: ?

## 2020-08-06 MED ORDER — TOPIRAMATE 25 MG TABLET
ORAL_TABLET | 0 refills | 0.00000 days | Status: CP
Start: 2020-08-06 — End: ?

## 2020-09-04 MED ORDER — TOPIRAMATE 25 MG TABLET
ORAL_TABLET | Freq: Every evening | ORAL | 0 refills | 0.00000 days | Status: CP
Start: 2020-09-04 — End: ?

## 2020-09-04 MED ORDER — PHENTERMINE 37.5 MG TABLET
ORAL_TABLET | 0 refills | 0.00000 days | Status: CP
Start: 2020-09-04 — End: ?

## 2020-10-28 DIAGNOSIS — I10 Essential (primary) hypertension: Principal | ICD-10-CM

## 2020-10-29 MED ORDER — PHENTERMINE 37.5 MG TABLET
ORAL_TABLET | 0 refills | 0.00000 days | Status: CP
Start: 2020-10-29 — End: ?

## 2020-10-29 MED ORDER — LISINOPRIL 10 MG TABLET
ORAL_TABLET | 0 refills | 0 days | Status: CP
Start: 2020-10-29 — End: ?

## 2020-12-14 ENCOUNTER — Ambulatory Visit: Payer: Self-pay

## 2020-12-15 ENCOUNTER — Ambulatory Visit
Admit: 2020-12-15 | Discharge: 2020-12-15 | Payer: PRIVATE HEALTH INSURANCE | Attending: Obesity Medicine | Primary: Obesity Medicine

## 2020-12-15 ENCOUNTER — Ambulatory Visit: Admit: 2020-12-15 | Discharge: 2020-12-15 | Payer: PRIVATE HEALTH INSURANCE

## 2020-12-15 DIAGNOSIS — G43009 Migraine without aura, not intractable, without status migrainosus: Principal | ICD-10-CM

## 2020-12-15 DIAGNOSIS — E6609 Other obesity due to excess calories: Principal | ICD-10-CM

## 2020-12-15 DIAGNOSIS — I10 Essential (primary) hypertension: Principal | ICD-10-CM

## 2020-12-15 DIAGNOSIS — Z6834 Body mass index (BMI) 34.0-34.9, adult: Principal | ICD-10-CM

## 2020-12-15 MED ORDER — HYDROXYZINE HCL 10 MG TABLET
ORAL_TABLET | Freq: Three times a day (TID) | ORAL | 0 refills | 10 days | Status: CP | PRN
Start: 2020-12-15 — End: ?

## 2020-12-15 MED ORDER — PHENTERMINE 37.5 MG TABLET
ORAL_TABLET | Freq: Every day | ORAL | 5 refills | 30.00000 days | Status: CP
Start: 2020-12-15 — End: ?

## 2020-12-15 MED ORDER — TOPIRAMATE 25 MG TABLET
ORAL_TABLET | Freq: Every evening | ORAL | 5 refills | 30 days | Status: CP
Start: 2020-12-15 — End: ?

## 2021-01-07 DIAGNOSIS — I10 Essential (primary) hypertension: Principal | ICD-10-CM

## 2021-01-07 MED ORDER — HYDROCHLOROTHIAZIDE 25 MG TABLET
ORAL_TABLET | 0 refills | 0 days | Status: CP
Start: 2021-01-07 — End: ?

## 2021-02-27 ENCOUNTER — Ambulatory Visit: Admit: 2021-02-27 | Payer: PRIVATE HEALTH INSURANCE

## 2021-04-01 ENCOUNTER — Ambulatory Visit: Admit: 2021-04-01 | Discharge: 2021-04-02 | Payer: PRIVATE HEALTH INSURANCE

## 2021-04-01 MED ORDER — MELOXICAM 15 MG TABLET
ORAL_TABLET | Freq: Every day | ORAL | 0 refills | 14 days | Status: CP
Start: 2021-04-01 — End: ?

## 2021-04-01 MED ORDER — LISINOPRIL 10 MG TABLET
ORAL_TABLET | Freq: Every day | ORAL | 1 refills | 90 days | Status: CP
Start: 2021-04-01 — End: ?

## 2021-04-01 MED ORDER — HYDROCHLOROTHIAZIDE 25 MG TABLET
ORAL_TABLET | Freq: Every day | ORAL | 1 refills | 90 days | Status: CP
Start: 2021-04-01 — End: ?

## 2021-04-28 ENCOUNTER — Emergency Department
Admit: 2021-04-28 | Discharge: 2021-04-28 | Disposition: A | Discharge status: Home / Self Care | Payer: PRIVATE HEALTH INSURANCE | Attending: Family

## 2021-04-28 ENCOUNTER — Ambulatory Visit
Admit: 2021-04-28 | Discharge: 2021-04-28 | Disposition: A | Discharge status: Home / Self Care | Payer: PRIVATE HEALTH INSURANCE | Attending: Family

## 2021-04-28 DIAGNOSIS — R079 Chest pain, unspecified: Principal | ICD-10-CM

## 2021-08-07 ENCOUNTER — Telehealth: Admit: 2021-08-07 | Discharge: 2021-08-08 | Payer: PRIVATE HEALTH INSURANCE

## 2021-08-07 DIAGNOSIS — H9201 Otalgia, right ear: Principal | ICD-10-CM

## 2021-08-07 DIAGNOSIS — J029 Acute pharyngitis, unspecified: Principal | ICD-10-CM

## 2021-08-17 ENCOUNTER — Ambulatory Visit: Admit: 2021-08-17 | Discharge: 2021-08-18 | Payer: PRIVATE HEALTH INSURANCE

## 2021-08-17 DIAGNOSIS — Z6834 Body mass index (BMI) 34.0-34.9, adult: Principal | ICD-10-CM

## 2021-08-17 DIAGNOSIS — E6609 Other obesity due to excess calories: Principal | ICD-10-CM

## 2021-08-17 DIAGNOSIS — I10 Essential (primary) hypertension: Principal | ICD-10-CM

## 2021-08-17 MED ORDER — WEGOVY 0.25 MG/0.5 ML SUBCUTANEOUS PEN INJECTOR
SUBCUTANEOUS | 1 refills | 0 days | Status: CP
Start: 2021-08-17 — End: ?

## 2021-08-17 MED ORDER — WEGOVY 0.5 MG/0.5 ML SUBCUTANEOUS PEN INJECTOR
SUBCUTANEOUS | 1 refills | 0 days | Status: CP
Start: 2021-08-17 — End: ?
  Filled 2021-08-31: qty 2, 28d supply, fill #0

## 2021-08-17 MED ORDER — WEGOVY 1 MG/0.5 ML SUBCUTANEOUS PEN INJECTOR
SUBCUTANEOUS | 1 refills | 0 days | Status: CP
Start: 2021-08-17 — End: ?

## 2021-08-19 ENCOUNTER — Encounter: Payer: Self-pay | Admitting: Intensive Care

## 2021-08-19 ENCOUNTER — Emergency Department
Admission: EM | Admit: 2021-08-19 | Discharge: 2021-08-19 | Disposition: A | Discharge status: Home / Self Care | Payer: Commercial Managed Care - PPO | Attending: Emergency Medicine | Admitting: Emergency Medicine

## 2021-08-19 ENCOUNTER — Other Ambulatory Visit: Payer: Self-pay

## 2021-08-19 DIAGNOSIS — R202 Paresthesia of skin: Secondary | ICD-10-CM | POA: Insufficient documentation

## 2021-08-19 LAB — URINALYSIS, ROUTINE W REFLEX MICROSCOPIC
Bilirubin Urine: NEGATIVE
Glucose, UA: NEGATIVE mg/dL
Ketones, ur: NEGATIVE mg/dL
Leukocytes,Ua: NEGATIVE
Nitrite: NEGATIVE
Protein, ur: NEGATIVE mg/dL
Specific Gravity, Urine: 1.011 (ref 1.005–1.030)
pH: 5 (ref 5.0–8.0)

## 2021-08-19 LAB — CBC WITH DIFFERENTIAL/PLATELET
Abs Immature Granulocytes: 0.01 10*3/uL (ref 0.00–0.07)
Basophils Absolute: 0 10*3/uL (ref 0.0–0.1)
Basophils Relative: 0 %
Eosinophils Absolute: 0 10*3/uL (ref 0.0–0.5)
Eosinophils Relative: 1 %
HCT: 42.6 % (ref 36.0–46.0)
Hemoglobin: 13.7 g/dL (ref 12.0–15.0)
Immature Granulocytes: 0 %
Lymphocytes Relative: 38 %
Lymphs Abs: 2.3 10*3/uL (ref 0.7–4.0)
MCH: 25.2 pg — ABNORMAL LOW (ref 26.0–34.0)
MCHC: 32.2 g/dL (ref 30.0–36.0)
MCV: 78.5 fL — ABNORMAL LOW (ref 80.0–100.0)
Monocytes Absolute: 0.4 10*3/uL (ref 0.1–1.0)
Monocytes Relative: 6 %
Neutro Abs: 3.3 10*3/uL (ref 1.7–7.7)
Neutrophils Relative %: 55 %
Platelets: 344 10*3/uL (ref 150–400)
RBC: 5.43 MIL/uL — ABNORMAL HIGH (ref 3.87–5.11)
RDW: 13.7 % (ref 11.5–15.5)
WBC: 6 10*3/uL (ref 4.0–10.5)
nRBC: 0 % (ref 0.0–0.2)

## 2021-08-19 LAB — COMPREHENSIVE METABOLIC PANEL
ALT: 12 U/L (ref 0–44)
AST: 20 U/L (ref 15–41)
Albumin: 4.1 g/dL (ref 3.5–5.0)
Alkaline Phosphatase: 70 U/L (ref 38–126)
Anion gap: 8 (ref 5–15)
BUN: 8 mg/dL (ref 6–20)
CO2: 28 mmol/L (ref 22–32)
Calcium: 9.1 mg/dL (ref 8.9–10.3)
Chloride: 100 mmol/L (ref 98–111)
Creatinine, Ser: 0.67 mg/dL (ref 0.44–1.00)
GFR, Estimated: >60 mL/min (ref >60)
Glucose, Bld: 91 mg/dL (ref 70–99)
Potassium: 3.6 mmol/L (ref 3.5–5.1)
Sodium: 136 mmol/L (ref 135–145)
Total Bilirubin: 0.5 mg/dL (ref 0.3–1.2)
Total Protein: 8.7 g/dL — ABNORMAL HIGH (ref 6.5–8.1)

## 2021-08-19 LAB — CK: Total CK: 107 U/L (ref 38–234)

## 2021-08-19 LAB — POC URINE PREG, ED: Preg Test, Ur: NEGATIVE

## 2021-08-19 LAB — MAGNESIUM: Magnesium: 2 mg/dL (ref 1.7–2.4)

## 2021-08-19 NOTE — ED Provider Notes (Signed)
? ?[facility] ?Provider Note ? ? ? Event Date/Time  ? First MD Initiated Contact with Patient 08/19/21 0935   ?  (approximate) ? ? ?History  ? ?Numbness ? ? ?HPI ? ?Yvonne Frank is a 36 y.o. female presents to the ED with complaint of paresthesia like sensation to her upper extremities bilaterally and to her left lower extremity.  Patient states this started on Saturday.  Patient denies any sensation to her face or the remaining left side of her body.  Patient is continue to ambulate and do task as normal.  She denies any headache or viral-like symptoms.  She rates her pain as a 0/10.  Patient has a history of hypertension ?  ? ? ?Physical Exam  ? ?Triage Vital Signs: ?ED Triage Vitals  ?Enc Vitals Group  ?   BP 08/19/21 0918 (!) 147/100  ?   Pulse Rate 08/19/21 0918 95  ?   Resp 08/19/21 0918 18  ?   Temp 08/19/21 0916 98.9 ?F (37.2 ?C)  ?   Temp Source 08/19/21 0916 Oral  ?   SpO2 08/19/21 0918 98 %  ?   Weight 08/19/21 0916 164 lb (74.4 kg)  ?   Height 08/19/21 0916 5\' 2"  (1.575 m)  ?   Head Circumference --   ?   Peak Flow --   ?   Pain Score 08/19/21 0916 0  ?   Pain Loc --   ?   Pain Edu? --   ?   Excl. in GC? --   ? ? ?Most recent vital signs: ?Vitals:  ? 08/19/21 0918 08/19/21 1143  ?BP: (!) 147/100 110/79  ?Pulse: 95 80  ?Resp: 18 18  ?Temp:  98.4 ?F (36.9 ?C)  ?SpO2: 98% 100%  ? ? ? ?General: Awake, no distress.  Alert, talkative. ?CV:  Good peripheral perfusion.  Heart regular rate and rhythm without murmur. ?Resp:  Normal effort.  Clear bilaterally. ?Abd:  No distention.  ?Other:  Cranial nerves II through XII grossly intact.  No point tenderness on palpation of cervical spine and range of motion is without restriction.  Patient is able to move both upper and lower extremities without any difficulty.  Good muscle strength at 5/5 bilaterally.  Reflexes lower extremities 2+ bilaterally.  Patient is able to stand and ambulate without any assistance.  No point tenderness is noted  on palpation of the thoracic or lumbar spine. ? ? ?ED Results / Procedures / Treatments  ? ?Labs ?(all labs ordered are listed, but only abnormal results are displayed) ?Labs Reviewed  ?CBC WITH DIFFERENTIAL/PLATELET - Abnormal; Notable for the following components:  ?    Result Value  ? RBC 5.43 (*)   ? MCV 78.5 (*)   ? MCH 25.2 (*)   ? All other components within normal limits  ?COMPREHENSIVE METABOLIC PANEL - Abnormal; Notable for the following components:  ? Total Protein 8.7 (*)   ? All other components within normal limits  ?URINALYSIS, ROUTINE W REFLEX MICROSCOPIC - Abnormal; Notable for the following components:  ? Color, Urine YELLOW (*)   ? APPearance CLEAR (*)   ? Hgb urine dipstick SMALL (*)   ? Bacteria, UA RARE (*)   ? All other components within normal limits  ?CK  ?MAGNESIUM  ?POC URINE PREG, ED  ? ? ? ? ?PROCEDURES: ? ?Critical Care performed:  ? ?Procedures ? ? ?MEDICATIONS ORDERED IN ED: ?Medications - No data to display ? ? ?IMPRESSION /  MDM / ASSESSMENT AND PLAN / ED COURSE  ?I reviewed the triage vital signs and the nursing notes. ? ? ?Differential diagnosis includes, but is not limited to, paresthesias bilateral upper extremity and left lower extremity. ? ?35 year old female presents to the ED with complaint of paresthesias bilateral upper extremity and left lower extremity without known history of injury.  She has been experiencing this for approximately 4 to 5 days and denies any other neurological symptoms.  Physical exam was benign.  Cranial nerves II through XII grossly intact, good muscle strength bilaterally, patient able to stand and ambulate without any assistance.  Lab work was reassuring with magnesium normal at 2.0, potassium 3.6 and remaining CMP negative, CBC with normal WBC at 6.0.  Pregnancy test was negative and urinalysis showed rare bacteria.  Patient was reassured.  She states that since she has been here that the symptoms have improved.  I discussed with her the need to  follow-up with her PCP and most likely neurology.  The neurologist that is on-call today is located in [place] and also one of the neurologist in [place] was written on her discharge papers if this is more convenient.  She is return to the emergency department if any severe worsening of her symptoms or urgent concerns. ? ? ? ?  ? ? ?FINAL CLINICAL IMPRESSION(S) / ED DIAGNOSES  ? ?Final diagnoses:  ?Paresthesias  ? ? ? ?Rx / DC Orders  ? ?ED Discharge Orders   ? ? None  ? ?  ? ? ? ?Note:  This document was prepared using Dragon voice recognition software and may include unintentional dictation errors. ?  ?Tommi Rumps, PA-C ?08/19/21 1202 ? ?  ?Sharyn Creamer, MD ?08/19/21 1702 ? ?

## 2021-08-19 NOTE — ED Notes (Signed)
See triage note.  ? ?Pt states recent tattoo on on L foot and states numbness and tingling has started after tattoo, pt denies any s/s of infection or concerns with tattoo integrity at this time. Pt is A&Ox4. ?

## 2021-08-19 NOTE — ED Triage Notes (Signed)
Pt comes into the ED via POV c/o tingling and numbness in the arm, leg, and feet bilaterally with it being worse on the left.  Pt states it started Saturday.  PT denies any other neurological symptoms and appears in NAD with even and unlabored respirations.  ? ?

## 2021-08-19 NOTE — Discharge Instructions (Addendum)
Follow-up with your primary care provider or one of the neurologist listed on your discharge papers.  Dr.Stack is in [place] and Dr. Malvin Johns is in [place] at [facility].  Lab work today was completely normal including potassium and magnesium. ?

## 2021-08-21 DIAGNOSIS — Z6834 Body mass index (BMI) 34.0-34.9, adult: Principal | ICD-10-CM

## 2021-08-21 DIAGNOSIS — E6609 Other obesity due to excess calories: Principal | ICD-10-CM

## 2021-08-25 ENCOUNTER — Other Ambulatory Visit: Payer: Self-pay

## 2021-08-25 ENCOUNTER — Emergency Department
Admission: EM | Admit: 2021-08-25 | Discharge: 2021-08-25 | Disposition: A | Discharge status: Home / Self Care | Payer: Commercial Managed Care - PPO | Attending: Emergency Medicine | Admitting: Emergency Medicine

## 2021-08-25 ENCOUNTER — Encounter: Payer: Self-pay | Admitting: Emergency Medicine

## 2021-08-25 DIAGNOSIS — J029 Acute pharyngitis, unspecified: Secondary | ICD-10-CM

## 2021-08-25 MED ORDER — IBUPROFEN 400 MG PO TABS
400.0000 mg | ORAL_TABLET | Freq: Once | ORAL | Status: DC
  Filled 2021-08-25: qty 1

## 2021-08-25 MED ORDER — ACETAMINOPHEN 500 MG PO TABS
1000.0000 mg | ORAL_TABLET | Freq: Once | ORAL | Status: DC
Start: 2021-08-25 — End: 2021-08-25
  Filled 2021-08-25: qty 2

## 2021-08-25 NOTE — ED Provider Notes (Signed)
? ?[facility] ?Provider Note ? ? ? Event Date/Time  ? First MD Initiated Contact with Patient 08/25/21 0335   ?  (approximate) ? ? ?History  ? ?Sore Throat ? ? ?HPI ? ?Shelli Portilla is a 36 y.o. female without significant past medical history who presents for evaluation of sore throat.  Patient states she noticed it earlier this evening.  She went to bed feeling fine last night.  She states it feels little tight and hurts to swallow.  No shortness of breath, cough, fevers, other neck pain or headache or earache, chest pain, abdominal pain, vomiting, diarrhea, rash or extremity pain.  She took a dose of amoxicillin that she had leftover from previous throat infection but has not taken any analgesia.  No difficulty speaking or change in her voice.  No actual difficulty swallowing or tolerating p.o. ? ?  ?History reviewed. No pertinent past medical history. ? ?Past Surgical History:  ?Procedure Laterality Date  ? CESAREAN SECTION    ? ? ? ?Physical Exam  ?Triage Vital Signs: ?ED Triage Vitals  ?Enc Vitals Group  ?   BP 08/25/21 0336 (!) 126/100  ?   Pulse Rate 08/25/21 0336 95  ?   Resp 08/25/21 0336 20  ?   Temp 08/25/21 0336 97.7 ?F (36.5 ?C)  ?   Temp Source 08/25/21 0336 Oral  ?   SpO2 08/25/21 0336 100 %  ?   Weight 08/25/21 0335 165 lb (74.8 kg)  ?   Height 08/25/21 0335 5\' 2"  (1.575 m)  ?   Head Circumference --   ?   Peak Flow --   ?   Pain Score 08/25/21 0335 6  ?   Pain Loc --   ?   Pain Edu? --   ?   Excl. in [place]? --   ? ? ?Most recent vital signs: ?Vitals:  ? 08/25/21 0336  ?BP: (!) 126/100  ?Pulse: 95  ?Resp: 20  ?Temp: 97.7 ?F (36.5 ?C)  ?SpO2: 100%  ? ? ?General: Awake, no distress.  ?CV:  Good peripheral perfusion.  ?Resp:  Normal effort.  ?Abd:  No distention.  ?Other:  Posterior oropharyngeal erythema and bilateral tonsillar enlargement with very subtle scant exudates bilaterally.  Patient has full range of motion of her neck including full extension without any significant  pain.  There is no stridor.  Oropharynx is otherwise unremarkable.  There is no edema. ? ? ?ED Results / Procedures / Treatments  ?Labs ?(all labs ordered are listed, but only abnormal results are displayed) ?Labs Reviewed  ?GROUP A STREP BY PCR  ? ? ? ?EKG ? ? ? ?RADIOLOGY ? ? ? ?PROCEDURES: ? ?Critical Care performed: No ? ?Procedures ? ? ? ?MEDICATIONS ORDERED IN ED: ?Medications  ?acetaminophen (TYLENOL) tablet 1,000 mg (has no administration in time range)  ?ibuprofen (ADVIL) tablet 400 mg (has no administration in time range)  ? ? ? ?IMPRESSION / MDM / ASSESSMENT AND PLAN / ED COURSE  ?I reviewed the triage vital signs and the nursing notes. ?             ?               ? ?Differential diagnosis includes, but is not limited to strep versus viral pharyngitis with very low suspicion at this time based on her history and exam for peritonsillar abscess, retropharyngeal abscess or other deep space infection in the head or neck.  She does not appear septic  toxic or meningitic.  There is no stridor or difficulty breathing or managing secretions and overall I have a low suspicion for anaphylaxis. ?  ?Unfortunately patient eloped prior to undergoing strep swab testing or this examiner having opportunity to reassess and discuss outpatient follow-up. ? ? ? ?FINAL CLINICAL IMPRESSION(S) / ED DIAGNOSES  ? ?Final diagnoses:  ?Pharyngitis, unspecified etiology  ? ? ? ?Rx / DC Orders  ? ?ED Discharge Orders   ? ? None  ? ?  ? ? ? ?Note:  This document was prepared using Dragon voice recognition software and may include unintentional dictation errors. ?  ?Gilles Chiquito, MD ?08/25/21 [phone] ? ?

## 2021-08-25 NOTE — ED Notes (Signed)
Entered patient room to give tylenol and strep swab. Pt not found in room. Searched bathrooms and checked triage. Patient was not found. MD notified.  ?

## 2021-08-25 NOTE — ED Triage Notes (Signed)
Patient ambulatory to triage with steady gait, without difficulty or distress noted; pt reports noted sore throat last night; awoke with sensation of "narrowing"; denies any recent illness ?

## 2021-09-24 DIAGNOSIS — Z3491 Encounter for supervision of normal pregnancy, unspecified, first trimester: Principal | ICD-10-CM

## 2021-09-25 ENCOUNTER — Ambulatory Visit: Admit: 2021-09-25 | Discharge: 2021-09-26 | Payer: PRIVATE HEALTH INSURANCE

## 2021-09-25 DIAGNOSIS — Z3491 Encounter for supervision of normal pregnancy, unspecified, first trimester: Principal | ICD-10-CM

## 2021-09-25 DIAGNOSIS — O3680X0 Pregnancy with inconclusive fetal viability, not applicable or unspecified: Principal | ICD-10-CM

## 2021-09-26 ENCOUNTER — Emergency Department
Admit: 2021-09-26 | Discharge: 2021-09-26 | Disposition: A | Discharge status: Home / Self Care | Payer: PRIVATE HEALTH INSURANCE | Attending: Emergency Medicine

## 2021-09-26 ENCOUNTER — Ambulatory Visit: Admit: 2021-09-26 | Discharge: 2021-09-26 | Discharge status: Against Medical Advice | Payer: PRIVATE HEALTH INSURANCE

## 2021-09-26 ENCOUNTER — Ambulatory Visit
Admit: 2021-09-26 | Discharge: 2021-09-26 | Disposition: A | Discharge status: Home / Self Care | Payer: PRIVATE HEALTH INSURANCE | Attending: Emergency Medicine

## 2021-09-26 DIAGNOSIS — O3680X0 Pregnancy with inconclusive fetal viability, not applicable or unspecified: Principal | ICD-10-CM

## 2021-09-26 DIAGNOSIS — O26891 Other specified pregnancy related conditions, first trimester: Principal | ICD-10-CM

## 2021-09-26 DIAGNOSIS — R102 Pelvic and perineal pain: Principal | ICD-10-CM

## 2021-09-28 ENCOUNTER — Encounter
Admit: 2021-09-28 | Discharge: 2021-09-29 | Payer: PRIVATE HEALTH INSURANCE | Attending: Student in an Organized Health Care Education/Training Program | Primary: Student in an Organized Health Care Education/Training Program

## 2021-09-28 ENCOUNTER — Ambulatory Visit: Admit: 2021-09-28 | Discharge: 2021-09-29 | Payer: PRIVATE HEALTH INSURANCE

## 2021-09-29 ENCOUNTER — Ambulatory Visit: Admit: 2021-09-29 | Discharge: 2021-09-30 | Payer: PRIVATE HEALTH INSURANCE

## 2021-09-30 ENCOUNTER — Ambulatory Visit: Admit: 2021-09-30 | Discharge: 2021-10-01 | Payer: PRIVATE HEALTH INSURANCE

## 2021-09-30 ENCOUNTER — Encounter: Admit: 2021-09-30 | Discharge: 2021-10-01 | Payer: PRIVATE HEALTH INSURANCE

## 2021-09-30 DIAGNOSIS — O3680X0 Pregnancy with inconclusive fetal viability, not applicable or unspecified: Principal | ICD-10-CM

## 2021-09-30 DIAGNOSIS — Z3491 Encounter for supervision of normal pregnancy, unspecified, first trimester: Principal | ICD-10-CM

## 2021-10-01 ENCOUNTER — Ambulatory Visit: Admit: 2021-10-01 | Discharge: 2021-10-01 | Payer: PRIVATE HEALTH INSURANCE

## 2021-10-01 ENCOUNTER — Encounter: Admit: 2021-10-01 | Discharge: 2021-10-01 | Payer: PRIVATE HEALTH INSURANCE

## 2021-10-01 ENCOUNTER — Encounter
Admit: 2021-10-01 | Discharge: 2021-10-01 | Payer: PRIVATE HEALTH INSURANCE | Attending: Anesthesiology | Primary: Anesthesiology

## 2021-10-02 DIAGNOSIS — O3680X0 Pregnancy with inconclusive fetal viability, not applicable or unspecified: Principal | ICD-10-CM

## 2021-10-04 DIAGNOSIS — O3680X0 Pregnancy with inconclusive fetal viability, not applicable or unspecified: Principal | ICD-10-CM

## 2021-10-08 ENCOUNTER — Ambulatory Visit: Admit: 2021-10-08 | Payer: PRIVATE HEALTH INSURANCE

## 2021-10-12 MED FILL — WEGOVY 0.25 MG/0.5 ML SUBCUTANEOUS PEN INJECTOR: SUBCUTANEOUS | 28 days supply | Qty: 2 | Fill #1

## 2021-10-22 ENCOUNTER — Telehealth: Admit: 2021-10-22 | Discharge: 2021-10-23 | Payer: PRIVATE HEALTH INSURANCE | Attending: Family | Primary: Family

## 2021-10-22 DIAGNOSIS — I10 Essential (primary) hypertension: Principal | ICD-10-CM

## 2021-10-22 DIAGNOSIS — E6609 Other obesity due to excess calories: Principal | ICD-10-CM

## 2021-10-22 DIAGNOSIS — Z6834 Body mass index (BMI) 34.0-34.9, adult: Principal | ICD-10-CM

## 2021-11-10 ENCOUNTER — Ambulatory Visit
Admit: 2021-11-10 | Discharge: 2021-11-11 | Payer: PRIVATE HEALTH INSURANCE | Attending: Student in an Organized Health Care Education/Training Program | Primary: Student in an Organized Health Care Education/Training Program

## 2021-11-10 DIAGNOSIS — Z9889 Other specified postprocedural states: Principal | ICD-10-CM

## 2021-11-19 DIAGNOSIS — I10 Essential (primary) hypertension: Principal | ICD-10-CM

## 2021-11-19 DIAGNOSIS — Z6834 Body mass index (BMI) 34.0-34.9, adult: Principal | ICD-10-CM

## 2021-11-19 DIAGNOSIS — E6609 Other obesity due to excess calories: Principal | ICD-10-CM

## 2021-11-19 MED ORDER — WEGOVY 0.25 MG/0.5 ML SUBCUTANEOUS PEN INJECTOR
SUBCUTANEOUS | 3 refills | 28 days | Status: CP
Start: 2021-11-19 — End: ?

## 2021-11-21 DIAGNOSIS — I10 Essential (primary) hypertension: Principal | ICD-10-CM

## 2021-11-23 MED ORDER — LISINOPRIL 10 MG TABLET
ORAL_TABLET | Freq: Every day | ORAL | 0 refills | 90 days | Status: CP
Start: 2021-11-23 — End: ?

## 2021-11-23 MED ORDER — HYDROCHLOROTHIAZIDE 25 MG TABLET
ORAL_TABLET | Freq: Every day | ORAL | 0 refills | 90 days | Status: CP
Start: 2021-11-23 — End: ?

## 2021-12-17 DIAGNOSIS — I10 Essential (primary) hypertension: Principal | ICD-10-CM

## 2021-12-17 DIAGNOSIS — Z6834 Body mass index (BMI) 34.0-34.9, adult: Principal | ICD-10-CM

## 2021-12-17 DIAGNOSIS — E6609 Other obesity due to excess calories: Principal | ICD-10-CM

## 2021-12-17 MED ORDER — WEGOVY 0.25 MG/0.5 ML SUBCUTANEOUS PEN INJECTOR
SUBCUTANEOUS | 0 refills | 28 days | Status: CP
Start: 2021-12-17 — End: ?

## 2021-12-31 DIAGNOSIS — Z3009 Encounter for other general counseling and advice on contraception: Principal | ICD-10-CM

## 2022-01-11 ENCOUNTER — Ambulatory Visit: Admit: 2022-01-11 | Discharge: 2022-01-12 | Payer: PRIVATE HEALTH INSURANCE | Attending: Family | Primary: Family

## 2022-01-11 DIAGNOSIS — Z6834 Body mass index (BMI) 34.0-34.9, adult: Principal | ICD-10-CM

## 2022-01-11 DIAGNOSIS — E6609 Other obesity due to excess calories: Principal | ICD-10-CM

## 2022-01-11 MED ORDER — PHENTERMINE 37.5 MG TABLET
ORAL_TABLET | Freq: Every day | ORAL | 3 refills | 30 days | Status: CP
Start: 2022-01-11 — End: ?

## 2022-01-11 MED ORDER — TOPIRAMATE 25 MG SPRINKLE CAPSULE
ORAL_CAPSULE | Freq: Every day | ORAL | 3 refills | 36 days | Status: CP
Start: 2022-01-11 — End: ?

## 2022-01-14 MED ORDER — WEGOVY 0.5 MG/0.5 ML SUBCUTANEOUS PEN INJECTOR
SUBCUTANEOUS | 0 refills | 0 days | Status: CP
Start: 2022-01-14 — End: 2022-02-05

## 2022-02-05 DIAGNOSIS — Z3009 Encounter for other general counseling and advice on contraception: Principal | ICD-10-CM

## 2022-02-23 ENCOUNTER — Ambulatory Visit: Admit: 2022-02-23 | Payer: PRIVATE HEALTH INSURANCE | Attending: Family | Primary: Family

## 2022-02-25 ENCOUNTER — Telehealth: Admit: 2022-02-25 | Discharge: 2022-02-26 | Payer: PRIVATE HEALTH INSURANCE | Attending: Family | Primary: Family

## 2022-02-25 DIAGNOSIS — I10 Essential (primary) hypertension: Principal | ICD-10-CM

## 2022-02-25 DIAGNOSIS — E6609 Other obesity due to excess calories: Principal | ICD-10-CM

## 2022-02-25 DIAGNOSIS — G43009 Migraine without aura, not intractable, without status migrainosus: Principal | ICD-10-CM

## 2022-02-25 DIAGNOSIS — Z6834 Body mass index (BMI) 34.0-34.9, adult: Principal | ICD-10-CM

## 2022-03-05 ENCOUNTER — Emergency Department: Payer: Commercial Managed Care - PPO

## 2022-03-05 ENCOUNTER — Other Ambulatory Visit: Payer: Self-pay

## 2022-03-05 ENCOUNTER — Ambulatory Visit: Admit: 2022-03-05 | Payer: PRIVATE HEALTH INSURANCE

## 2022-03-05 DIAGNOSIS — R091 Pleurisy: Secondary | ICD-10-CM | POA: Diagnosis present

## 2022-03-05 LAB — BASIC METABOLIC PANEL
Anion gap: 8 (ref 5–15)
BUN: 13 mg/dL (ref 6–20)
CO2: 30 mmol/L (ref 22–32)
Calcium: 9.1 mg/dL (ref 8.9–10.3)
Chloride: 101 mmol/L (ref 98–111)
Creatinine, Ser: 0.78 mg/dL (ref 0.44–1.00)
GFR, Estimated: >60 mL/min (ref >60)
Glucose, Bld: 100 mg/dL — ABNORMAL HIGH (ref 70–99)
Potassium: 3.5 mmol/L (ref 3.5–5.1)
Sodium: 139 mmol/L (ref 135–145)

## 2022-03-05 LAB — CBC
HCT: 38.9 % (ref 36.0–46.0)
Hemoglobin: 12.6 g/dL (ref 12.0–15.0)
MCH: 25.8 pg — ABNORMAL LOW (ref 26.0–34.0)
MCHC: 32.4 g/dL (ref 30.0–36.0)
MCV: 79.6 fL — ABNORMAL LOW (ref 80.0–100.0)
Platelets: 315 10*3/uL (ref 150–400)
RBC: 4.89 MIL/uL (ref 3.87–5.11)
RDW: 14 % (ref 11.5–15.5)
WBC: 9.1 10*3/uL (ref 4.0–10.5)
nRBC: 0 % (ref 0.0–0.2)

## 2022-03-05 LAB — TROPONIN I (HIGH SENSITIVITY): Troponin I (High Sensitivity): 2 ng/L (ref <18)

## 2022-03-05 NOTE — ED Triage Notes (Signed)
Pt reports chest pain when breathing or with movement starting on Monday. Pain has been intermittent. Denies N/V/SOB/fever. Pain is in upper right area and is dull in nature.

## 2022-03-06 ENCOUNTER — Emergency Department
Admission: EM | Admit: 2022-03-06 | Discharge: 2022-03-06 | Disposition: A | Discharge status: Home / Self Care | Payer: Commercial Managed Care - PPO | Attending: Emergency Medicine | Admitting: Emergency Medicine

## 2022-03-06 DIAGNOSIS — R0789 Other chest pain: Secondary | ICD-10-CM

## 2022-03-06 DIAGNOSIS — R079 Chest pain, unspecified: Secondary | ICD-10-CM

## 2022-03-06 LAB — D-DIMER, QUANTITATIVE: D-Dimer, Quant: <0.27 ug/mL-FEU (ref 0.00–0.50)

## 2022-03-06 LAB — PREGNANCY, URINE: Preg Test, Ur: NEGATIVE

## 2022-03-06 LAB — TROPONIN I (HIGH SENSITIVITY): Troponin I (High Sensitivity): 3 ng/L (ref <18)

## 2022-03-06 MED ORDER — NAPROXEN 500 MG PO TABS: 500.0000 mg | ORAL_TABLET | Freq: Two times a day (BID) | ORAL | 0 refills | Status: AC

## 2022-03-06 MED ORDER — KETOROLAC TROMETHAMINE 60 MG/2ML IM SOLN
30.0000 mg | Freq: Once | INTRAMUSCULAR | Status: AC
  Administered 2022-03-06: 30 mg via INTRAMUSCULAR

## 2022-03-06 MED ORDER — KETOROLAC TROMETHAMINE 30 MG/ML IJ SOLN
15.0000 mg | Freq: Once | INTRAMUSCULAR | Status: DC
  Filled 2022-03-06: qty 1

## 2022-03-06 NOTE — ED Notes (Signed)
DC instructions given verbally and in writing, understanding voiced, signature obtained.  Pt left in stable condition to POV. 

## 2022-03-06 NOTE — Discharge Instructions (Signed)
Take Naprosyn as needed for pain.  Apply moist heat to affected area several times daily.  Return to the ER for worsening symptoms, persistent vomiting, difficulty breathing or other concerns.

## 2022-03-06 NOTE — ED Provider Notes (Signed)
[facility] Provider Note    Event Date/Time   First MD Initiated Contact with Patient 03/06/22 0257     (approximate)   History   Pleurisy (Pt reports chest pain when breathing or with movement starting on Monday. Pain has been intermittent. Denies N/V/SOB/fever. Pain is in upper right area and is dull in nature. )   HPI  Yvonne Frank is a 36 y.o. female  who presents to the emergency department from home with a chief complaint of chest pain. Patient reports a six day history of right upper chest/clavicle pain worse with deep breathing and movements.  Denies associated diaphoresis, shortness of breath, nausea/vomiting, palpitations or dizziness.  May have incurred injury from lifting her 40 pound daughter.  Denies abdominal pain.  Denies recent travel, trauma or hormone use.     Past Medical History  No past medical history on file.   Active Problem List  There are no problems to display for this patient.    Past Surgical History   Past Surgical History:  Procedure Laterality Date  . CESAREAN SECTION       Home Medications   Prior to Admission medications   Medication Sig Start Date End Date Taking? Authorizing Provider  JUNEL FE 1/20 1-20 MG-MCG tablet  07/08/17   [provider]  lidocaine (XYLOCAINE) 2 % solution 20 ml gargle and spit q 6 hours prn 09/07/17   Norval Gable, MD     Allergies  Patient has no known allergies.   Family History  No family history on file.   Physical Exam  Triage Vital Signs: ED Triage Vitals  Enc Vitals Group     BP 03/05/22 2148 (!) 136/93     Pulse Rate 03/05/22 2148 83     Resp 03/05/22 2148 14     Temp 03/05/22 2148 98.1 F (36.7 C)     Temp Source 03/05/22 2148 Oral     SpO2 03/05/22 2148 97 %     Weight 03/05/22 2150 175 lb (79.4 kg)     Height 03/05/22 2150 5\' 2"  (1.575 m)     Head Circumference --      Peak Flow --      Pain Score 03/05/22 2149 7     Pain Loc --       Pain Edu? --      Excl. in [place]? --     Updated Vital Signs: BP (!) 125/92   Pulse 74   Temp 98.3 F (36.8 C) (Oral)   Resp 18   Ht 5\' 2"  (1.575 m)   Wt 79.4 kg   SpO2 100%   BMI 32.01 kg/m    General: Awake, no distress.  CV:  RRR.  Good peripheral perfusion.  Resp:  Normal effort.  CTA B.  Mild tenderness to palpation right upper anterior chest wall near clavicle. Abd:  Nontender.  No distention.  Other:  Bilateral calves are nontender and nonswollen.   ED Results / Procedures / Treatments  Labs (all labs ordered are listed, but only abnormal results are displayed) Labs Reviewed  BASIC METABOLIC PANEL - Abnormal; Notable for the following components:      Result Value   Glucose, Bld 100 (*)    All other components within normal limits  CBC - Abnormal; Notable for the following components:   MCV 79.6 (*)    MCH 25.8 (*)    All other components within normal limits  PREGNANCY, URINE  D-DIMER, QUANTITATIVE  TROPONIN I (HIGH SENSITIVITY)  TROPONIN I (HIGH SENSITIVITY)     EKG  ED ECG REPORT I, Carolyn Sylvia J, the attending physician, personally viewed and interpreted this ECG.   Date: 03/06/2022  EKG Time: 2146  Rate: 81  Rhythm: normal sinus rhythm  Axis: Normal  Intervals:none  ST&T Change: Nonspecific    RADIOLOGY I have independently visualized and interpreted patient's chest x-ray as well as noted the radiology interpretation:  Chest x-ray: Atelectasis  Official radiology report(s): DG Chest 2 View  Result Date: 03/05/2022 CLINICAL DATA:  Chest pain when breathing or with movement starting on Monday EXAM: CHEST - 2 VIEW COMPARISON:  None Available. FINDINGS: Airspace opacities at the left lung base. No pleural effusion, or pneumothorax. Normal cardiomediastinal silhouette. No acute osseous abnormality. IMPRESSION: Airspace opacities at the left lung base are favored atelectasis. Electronically Signed   By: Minerva Fester M.D.   On: 03/05/2022 22:13      PROCEDURES:  Critical Care performed: No  Procedures   MEDICATIONS ORDERED IN ED: Medications  ketorolac (TORADOL) injection 30 mg (30 mg Intramuscular Given 03/06/22 0336)     IMPRESSION / MDM / ASSESSMENT AND PLAN / ED COURSE  I reviewed the triage vital signs and the nursing notes.                             36 year old female presenting with anterior chest wall pain. Differential diagnosis includes, but is not limited to, ACS, aortic dissection, pulmonary embolism, cardiac tamponade, pneumothorax, pneumonia, pericarditis, myocarditis, GI-related causes including esophagitis/gastritis, and musculoskeletal chest wall pain.   I have personally reviewed patient's records and note a PCP telemedicine visit on 02/25/2022 for obesity, hypertension and migraine.  Patient's presentation is most consistent with acute presentation with potential threat to life or bodily function.  The patient is on the cardiac monitor to evaluate for evidence of arrhythmia and/or significant heart rate changes.  Laboratory results unremarkable including 2 sets of negative troponins.  Will administer Toradol and add D-dimer.  Clinical Course as of 03/06/22 0441  Sat Mar 06, 2022  0335 D-dimer negative.  Will administer NSAIDs. [JS]  0439 Pain down to 3/10.  2 sets of negative troponins.  Will refer to cardiology for outpatient follow-up.  Feel patient is safe for discharge home with as needed NSAIDs.  Strict return precautions given.  Patient verbalizes understanding and agrees with plan of care. [JS]    Clinical Course User Index [JS] Irean Hong, MD     FINAL CLINICAL IMPRESSION(S) / ED DIAGNOSES   Final diagnoses:  Chest pain, unspecified type  Chest wall pain     Rx / DC Orders   ED Discharge Orders     None        Note:  This document was prepared using Dragon voice recognition software and may include unintentional dictation errors.   Irean Hong, MD 03/06/22 [phone]

## 2022-03-24 ENCOUNTER — Ambulatory Visit: Admit: 2022-03-24 | Payer: PRIVATE HEALTH INSURANCE

## 2022-04-02 DIAGNOSIS — Z3009 Encounter for other general counseling and advice on contraception: Principal | ICD-10-CM

## 2022-05-02 DIAGNOSIS — I10 Essential (primary) hypertension: Principal | ICD-10-CM

## 2022-05-07 ENCOUNTER — Ambulatory Visit: Admit: 2022-05-07 | Discharge: 2022-05-07 | Payer: PRIVATE HEALTH INSURANCE

## 2022-05-07 ENCOUNTER — Ambulatory Visit
Admit: 2022-05-07 | Discharge: 2022-05-07 | Payer: PRIVATE HEALTH INSURANCE | Attending: Student in an Organized Health Care Education/Training Program | Primary: Student in an Organized Health Care Education/Training Program

## 2022-05-07 DIAGNOSIS — G43009 Migraine without aura, not intractable, without status migrainosus: Principal | ICD-10-CM

## 2022-05-07 DIAGNOSIS — E669 Obesity, unspecified: Principal | ICD-10-CM

## 2022-05-07 DIAGNOSIS — I10 Essential (primary) hypertension: Principal | ICD-10-CM

## 2022-05-07 MED ORDER — LISINOPRIL 10 MG TABLET
ORAL_TABLET | Freq: Every day | ORAL | 1 refills | 90 days | Status: CP
Start: 2022-05-07 — End: ?

## 2022-05-07 MED ORDER — TOPIRAMATE 25 MG SPRINKLE CAPSULE
ORAL_CAPSULE | Freq: Every day | ORAL | 3 refills | 36 days | Status: CP
Start: 2022-05-07 — End: ?

## 2022-05-07 MED ORDER — PHENTERMINE 37.5 MG TABLET
ORAL_TABLET | Freq: Every day | ORAL | 3 refills | 30 days | Status: CP
Start: 2022-05-07 — End: ?

## 2022-05-07 MED ORDER — HYDROCHLOROTHIAZIDE 25 MG TABLET
ORAL_TABLET | Freq: Every day | ORAL | 1 refills | 90 days | Status: CP
Start: 2022-05-07 — End: ?

## 2022-05-14 DIAGNOSIS — E669 Obesity, unspecified: Principal | ICD-10-CM

## 2022-05-14 DIAGNOSIS — G43009 Migraine without aura, not intractable, without status migrainosus: Principal | ICD-10-CM

## 2022-05-14 MED ORDER — TOPIRAMATE 25 MG TABLET
ORAL_TABLET | 5 refills | 0 days | Status: CP
Start: 2022-05-14 — End: ?

## 2022-05-25 DIAGNOSIS — Z6834 Body mass index (BMI) 34.0-34.9, adult: Principal | ICD-10-CM

## 2022-05-25 DIAGNOSIS — E6609 Other obesity due to excess calories: Principal | ICD-10-CM

## 2022-05-25 MED ORDER — ZEPBOUND 2.5 MG/0.5 ML SUBCUTANEOUS PEN INJECTOR
SUBCUTANEOUS | 0 refills | 0 days | Status: CP
Start: 2022-05-25 — End: 2022-06-16

## 2022-06-22 MED ORDER — ZEPBOUND 5 MG/0.5 ML SUBCUTANEOUS PEN INJECTOR
SUBCUTANEOUS | 1 refills | 0 days | Status: CP
Start: 2022-06-22 — End: 2022-07-14

## 2022-06-24 ENCOUNTER — Ambulatory Visit
Admit: 2022-06-24 | Payer: BLUE CROSS/BLUE SHIELD | Attending: Student in an Organized Health Care Education/Training Program | Primary: Student in an Organized Health Care Education/Training Program

## 2022-06-28 ENCOUNTER — Ambulatory Visit
Admit: 2022-06-28 | Discharge: 2022-06-29 | Payer: BLUE CROSS/BLUE SHIELD | Attending: Emergency Medicine | Primary: Emergency Medicine

## 2022-06-28 DIAGNOSIS — M62838 Other muscle spasm: Principal | ICD-10-CM

## 2022-06-28 MED ORDER — CYCLOBENZAPRINE 10 MG TABLET
ORAL_TABLET | Freq: Every evening | ORAL | 0 refills | 24 days | Status: CP | PRN
Start: 2022-06-28 — End: ?

## 2022-08-20 ENCOUNTER — Telehealth
Admit: 2022-08-20 | Discharge: 2022-08-21 | Payer: PRIVATE HEALTH INSURANCE | Attending: Student in an Organized Health Care Education/Training Program | Primary: Student in an Organized Health Care Education/Training Program

## 2022-08-20 DIAGNOSIS — E669 Obesity, unspecified: Principal | ICD-10-CM

## 2022-08-20 DIAGNOSIS — G43009 Migraine without aura, not intractable, without status migrainosus: Principal | ICD-10-CM

## 2022-08-20 DIAGNOSIS — I10 Essential (primary) hypertension: Principal | ICD-10-CM

## 2022-08-20 MED ORDER — SEMAGLUTIDE (WEIGHT LOSS) 0.25 MG/0.5 ML SUBCUTANEOUS PEN INJECTOR
SUBCUTANEOUS | 0 refills | 28 days | Status: CP
Start: 2022-08-20 — End: ?

## 2022-08-20 MED ORDER — SEMAGLUTIDE (WEIGHT LOSS) 1 MG/0.5 ML SUBCUTANEOUS PEN INJECTOR
SUBCUTANEOUS | 1 refills | 0 days | Status: CP
Start: 2022-08-20 — End: ?

## 2022-08-20 MED ORDER — SEMAGLUTIDE (WEIGHT LOSS) 0.5 MG/0.5 ML SUBCUTANEOUS PEN INJECTOR
SUBCUTANEOUS | 0 refills | 0 days | Status: CP
Start: 2022-08-20 — End: ?

## 2022-09-23 ENCOUNTER — Ambulatory Visit
Admit: 2022-09-23 | Discharge: 2022-09-24 | Payer: PRIVATE HEALTH INSURANCE | Attending: Student in an Organized Health Care Education/Training Program | Primary: Student in an Organized Health Care Education/Training Program

## 2022-09-23 DIAGNOSIS — M62838 Other muscle spasm: Principal | ICD-10-CM

## 2022-09-23 MED ORDER — PREDNISONE 20 MG TABLET
ORAL_TABLET | Freq: Every day | ORAL | 0 refills | 3 days | Status: CP
Start: 2022-09-23 — End: 2022-09-26

## 2022-09-23 MED ORDER — CYCLOBENZAPRINE 10 MG TABLET
ORAL_TABLET | Freq: Three times a day (TID) | ORAL | 0 refills | 5 days | Status: CP
Start: 2022-09-23 — End: 2022-09-28

## 2022-12-03 ENCOUNTER — Ambulatory Visit
Admit: 2022-12-03 | Discharge: 2022-12-04 | Payer: PRIVATE HEALTH INSURANCE | Attending: Student in an Organized Health Care Education/Training Program | Primary: Student in an Organized Health Care Education/Training Program

## 2022-12-03 DIAGNOSIS — E669 Obesity, unspecified: Principal | ICD-10-CM

## 2022-12-03 DIAGNOSIS — G43009 Migraine without aura, not intractable, without status migrainosus: Principal | ICD-10-CM

## 2022-12-03 DIAGNOSIS — I10 Essential (primary) hypertension: Principal | ICD-10-CM

## 2022-12-03 MED ORDER — SEMAGLUTIDE (WEIGHT LOSS) 1.7 MG/0.75 ML SUBCUTANEOUS PEN INJECTOR
SUBCUTANEOUS | 0 refills | 28 days | Status: CP
Start: 2022-12-03 — End: ?
  Filled 2022-12-28: qty 2, 28d supply, fill #0

## 2022-12-03 MED ORDER — SEMAGLUTIDE (WEIGHT LOSS) 1 MG/0.5 ML SUBCUTANEOUS PEN INJECTOR
SUBCUTANEOUS | 0 refills | 0 days | Status: CP
Start: 2022-12-03 — End: ?

## 2022-12-03 MED ORDER — SEMAGLUTIDE (WEIGHT LOSS) 2.4 MG/0.75 ML SUBCUTANEOUS PEN INJECTOR
SUBCUTANEOUS | 3 refills | 0 days | Status: CP
Start: 2022-12-03 — End: ?

## 2022-12-06 DIAGNOSIS — I10 Essential (primary) hypertension: Principal | ICD-10-CM

## 2022-12-06 MED ORDER — HYDROCHLOROTHIAZIDE 12.5 MG TABLET
ORAL_TABLET | Freq: Every day | ORAL | 11 refills | 30 days | Status: CP
Start: 2022-12-06 — End: 2023-12-06

## 2022-12-16 ENCOUNTER — Other Ambulatory Visit: Payer: Self-pay

## 2022-12-16 ENCOUNTER — Emergency Department: Payer: BC Managed Care – PPO

## 2022-12-16 DIAGNOSIS — Z5321 Procedure and treatment not carried out due to patient leaving prior to being seen by health care provider: Secondary | ICD-10-CM | POA: Diagnosis not present

## 2022-12-16 DIAGNOSIS — R0789 Other chest pain: Secondary | ICD-10-CM | POA: Diagnosis present

## 2022-12-16 LAB — BASIC METABOLIC PANEL
Anion gap: 8 (ref 5–15)
BUN: 11 mg/dL (ref 6–20)
CO2: 25 mmol/L (ref 22–32)
Calcium: 8.8 mg/dL — ABNORMAL LOW (ref 8.9–10.3)
Chloride: 107 mmol/L (ref 98–111)
Creatinine, Ser: 0.79 mg/dL (ref 0.44–1.00)
GFR, Estimated: >60 mL/min (ref >60)
Glucose, Bld: 103 mg/dL — ABNORMAL HIGH (ref 70–99)
Potassium: 3.5 mmol/L (ref 3.5–5.1)
Sodium: 140 mmol/L (ref 135–145)

## 2022-12-16 LAB — TROPONIN I (HIGH SENSITIVITY): Troponin I (High Sensitivity): <2 ng/L (ref <18)

## 2022-12-16 LAB — CBC
HCT: 36.2 % (ref 36.0–46.0)
Hemoglobin: 12.3 g/dL (ref 12.0–15.0)
MCH: 26.9 pg (ref 26.0–34.0)
MCHC: 34 g/dL (ref 30.0–36.0)
MCV: 79 fL — ABNORMAL LOW (ref 80.0–100.0)
Platelets: 300 10*3/uL (ref 150–400)
RBC: 4.58 MIL/uL (ref 3.87–5.11)
RDW: 14.5 % (ref 11.5–15.5)
WBC: 9.4 10*3/uL (ref 4.0–10.5)
nRBC: 0 % (ref 0.0–0.2)

## 2022-12-16 NOTE — ED Triage Notes (Signed)
Pt to ED via POV c/o central chest discomfort, near throat that startes 2 hrs ago. Pain is nonradiating. Denies SOB, N/V, dizziness.

## 2022-12-17 ENCOUNTER — Emergency Department
Admission: EM | Admit: 2022-12-17 | Discharge: 2022-12-17 | Discharge status: Against Medical Advice | Payer: BC Managed Care – PPO | Attending: Emergency Medicine | Admitting: Emergency Medicine

## 2022-12-17 ENCOUNTER — Ambulatory Visit: Admit: 2022-12-17 | Discharge: 2022-12-18 | Payer: PRIVATE HEALTH INSURANCE

## 2022-12-17 MED ORDER — HYDROXYZINE HCL 25 MG TABLET
ORAL_TABLET | Freq: Three times a day (TID) | ORAL | 0 refills | 10 days | Status: CP | PRN
Start: 2022-12-17 — End: ?

## 2022-12-28 NOTE — Unmapped (Signed)
[facility] Specialty Medication Onboarding    Specialty Medication: WEGOVY 1 mg/0.5 mL injection pen (semaglutide (weight loss))  Prior Authorization: Not Required   Financial Assistance: No - copay  <$25  Final Copay/Day Supply: $24.99 / 28     Insurance Restrictions: None     Notes to Pharmacist: new to therapy  Credit Card on File: no    The triage team has completed the benefits investigation and has determined that the patient is able to fill this medication at [facility]. Please contact the patient to complete the onboarding or follow up with the prescribing physician as needed.

## 2023-01-04 ENCOUNTER — Ambulatory Visit: Admit: 2023-01-04 | Discharge: 2023-01-05 | Payer: PRIVATE HEALTH INSURANCE

## 2023-01-04 NOTE — Unmapped (Signed)
It was a pleasure seeing you today.  Apply ice to the back of your lower leg above your heel for 20 minutes 3-4 times daily.  Take ibuprofen 600 mg with food three times daily for five days then take as needed for the pain.  Return to clinic if your symptoms worsen or do not improve.

## 2023-01-04 NOTE — Unmapped (Signed)
[facility]    Patient ID: Madison Peterson is a 37 y.o. female who presents for new concerns of bilateral lower leg and ankle pain.  PCP: Criss Rosales, MD      Informant: Patient is accompanied by friend.    Assessment/Plan:      1. Pain and swelling of left lower extremity  - PVL Venous Duplex Lower Extremity Left; Future    2. Non-insertional Achilles tendinopathy  -Start ibuprofen 600 mg with food three times daily for four to five days then take as needed.  -Start Achilles exercises daily.    Return to clinic if symptoms worsen or do not improve.           Preventive services addressed today  Pap: already scheduled    -- Patient verbalized an understanding of today's assessment and recommendations.    I personally spent 32 minutes face-to-face and non-face-to-face in the care of this patient, which includes all pre, intra, and post visit time on the date of service.    No follow-ups on file.    Subjective:     Chief Complaint   Patient presents with    Leg Pain     started on Monday with left knee and leg now its both legs just came back from [place]        HPI. She traveled by car, 10 hours one way, to [place] last week and developed pain and swelling in left knee and leg. Now  both legs are painful. Knee pain has improved. Did a lot of walking while at amusement parks in [place]. No trauma, injury or fall.    ROS  As per HPI.    RELEVANT PAST MEDICAL, FAMILY, SURGICAL, SOCIAL HISTORY  Reviewed today  ___________________________________  CURRENT MEDS  Current Outpatient Medications   Medication Sig Dispense Refill    cyclobenzaprine (FLEXERIL) 10 MG tablet Take 1 tablet (10 mg total) by mouth nightly as needed for muscle spasms for up to 24 doses. 24 tablet 0    hydroCHLOROthiazide 12.5 MG tablet Take 1 tablet (12.5 mg total) by mouth daily. 30 tablet 11    hydrOXYzine (ATARAX) 25 MG tablet Take 1 tablet (25 mg total) by mouth every eight (8) hours as needed. 30 tablet 0    lisinopriL (PRINIVIL,ZESTRIL) 10 MG tablet Take 1 tablet (10 mg total) by mouth daily. 90 tablet 1    semaglutide, weight loss, (WEGOVY) 1 mg/0.5 mL injection pen Starting after 0.5 mg dose titration, Inject 1 mg under the skin every seven (7) days. 2 mL 0    semaglutide, weight loss, (WEGOVY) 1.7 mg/0.75 mL injection pen Inject 0.75 mL (1.7 mg total) under the skin every seven (7) days. 3 mL 0    semaglutide, weight loss, (WEGOVY) 2.4 mg/0.75 mL injection pen Inject 2.4 mg under the skin every seven (7) days. 3 mL 3    topiramate (TOPAMAX) 25 MG tablet Take 2 tablets (50 mg total) by mouth daily.       No current facility-administered medications for this visit.       ___________________________________  ALLERGIES  No Known Allergies      Objective:       Vital Signs  BP 134/98 (BP Site: L Arm, BP Position: Sitting, BP Cuff Size: Large)  - Pulse 83  - Temp 36.8 ??C (98.3 ??F) (Temporal)  - Ht 157.5 cm (5' 2) Comment: pt reported - Wt 80.6 kg (177 lb 12.8 oz)  - BMI 32.52  kg/m??      Exam  Vitals reviewed.   Constitutional:       Appearance: Normal and healthy appearance. Well-developed, well-groomed and not in distress.   Cardiovascular:      Comments: Right calf 41 cm, Left calf 42 cm.  Pulses:     Dorsalis pedis: 2+ bilaterally.  Edema:     Peripheral edema absent.   Musculoskeletal:      Right knee: Normal.      Left knee: No swelling, deformity, effusion, erythema or crepitus. Normal range of motion. Tenderness present over the medial joint line. Normal alignment.      Comments: Left knee with tenderness to palpation at the medial joint line.  Tenderness to palpation at the left achilles tendon. No tenderness to palpation at the right achilles tendon. Neurological:      Mental Status: Alert.   Psychiatric:         Attention and Perception: Attention and perception normal.         Mood and Affect: Mood and affect normal.         Speech: Speech normal.         Behavior: Behavior is cooperative.         Thought Content: Thought content normal.          Data   Per[facility] Lab      [address]    [place], [place] [address]     PVL VENOUS DUPLEX LOWER EXTREMITY LEFT  Patient Demographics Pt. Name: JOSELINNE LAWAL Location: [facility]  MRN:      [id number]      Sex:      F  DOB:      [birth year]         Age:      38 years     Study Information  Authorizing         [phone] [facility]       Performed Time       01/04/2023 2:08:59  Provider Name       MARRIOTT Ellesse Antenucci                             PM  Ordering Physician  Estanislado Spire    Patient Location     [facility]                      Dahlia Nifong  Accession Number    [id number]         Technologist         Scharlene Gloss                                                                 [place]  Diagnosis:                                Assisting                                            Technologist  Ordered Reason For Exam: pain and  swelling left lower extremity after 20 hour car ride  Indication: Swelling  Protocol  The major deep veins from the inguinal ligament to the ankle are assessed for compressibility and color and spectral Doppler flow characteristics on the requested limb. The assessed veins include common femoral vein, femoral vein in the thigh, popliteal vein, and intramuscular calf veins. The iliac vein is assessed indirectly using Doppler waveform analysis. The great saphenous vein is assessed for compressibility at the saphenofemoral junction, and the small saphenous vein assessed for compressibility behind the knee. A contralateral PW Doppler waveform is obtained for comparison.     Final Interpretation  Right  There is no evidence of obstruction proximal to the inguinal ligament or in the common femoral vein.  Left  There is no evidence of DVT in the lower extremity. There is no evidence of obstruction proximal to the inguinal ligament or in the common femoral vein.     Electronically signed by [id number] Jodell Cipro MD on 01/04/2023 at 3:35:10 PM. --------------------------------------------------------------------------------  Right Duplex Findings  CFV Doppler waveform appears appropriately phasic.     Left Duplex Findings  All veins visualized appear fully compressible. Doppler flow signals demonstrate normal spontaneity, phasicity, and augmentation.  Right Technical Summary  No evidence of iliofemoral obstruction.  Left Technical Summary  No evidence of deep venous obstruction in the lower extremity. No indirect evidence of obstruction proximal to the inguinal ligament.

## 2023-01-13 NOTE — Unmapped (Signed)
The [facility] has reached out to this patient via MyChart to onboard them to our Specialty Lite services for their [facility]. Their medication was last delivered on 12/28/22.They will now receive proactive outreach from the pharmacy team for refills.    Camillo Flaming, PharmD  [facility] Pharmacist

## 2023-01-31 ENCOUNTER — Ambulatory Visit: Admit: 2023-01-31 | Payer: PRIVATE HEALTH INSURANCE

## 2023-02-01 DIAGNOSIS — E669 Obesity, unspecified: Principal | ICD-10-CM

## 2023-02-01 MED ORDER — SEMAGLUTIDE (WEIGHT LOSS) 1 MG/0.5 ML SUBCUTANEOUS PEN INJECTOR
SUBCUTANEOUS | 2 refills | 28 days | Status: CP
Start: 2023-02-01 — End: ?

## 2023-02-03 ENCOUNTER — Emergency Department
Admit: 2023-02-03 | Discharge: 2023-02-04 | Disposition: A | Discharge status: Home / Self Care | Payer: PRIVATE HEALTH INSURANCE

## 2023-02-03 ENCOUNTER — Ambulatory Visit
Admit: 2023-02-03 | Discharge: 2023-02-04 | Disposition: A | Discharge status: Home / Self Care | Payer: PRIVATE HEALTH INSURANCE

## 2023-02-03 DIAGNOSIS — Z331 Pregnant state, incidental: Principal | ICD-10-CM

## 2023-02-03 LAB — CBC W/ AUTO DIFF
BASOPHILS ABSOLUTE COUNT: 0 10*9/L (ref 0.0–0.1)
BASOPHILS RELATIVE PERCENT: 0.6 %
EOSINOPHILS ABSOLUTE COUNT: 0.1 10*9/L (ref 0.0–0.5)
EOSINOPHILS RELATIVE PERCENT: 1.5 %
HEMATOCRIT: 37.2 % (ref 34.0–44.0)
HEMOGLOBIN: 12.6 g/dL (ref 11.3–14.9)
LYMPHOCYTES ABSOLUTE COUNT: 2.7 10*9/L (ref 1.1–3.6)
LYMPHOCYTES RELATIVE PERCENT: 32.7 %
MEAN CORPUSCULAR HEMOGLOBIN CONC: 33.8 g/dL (ref 32.0–36.0)
MEAN CORPUSCULAR HEMOGLOBIN: 26.4 pg (ref 25.9–32.4)
MEAN CORPUSCULAR VOLUME: 78.2 fL (ref 77.6–95.7)
MEAN PLATELET VOLUME: 7.7 fL (ref 6.8–10.7)
MONOCYTES ABSOLUTE COUNT: 0.4 10*9/L (ref 0.3–0.8)
MONOCYTES RELATIVE PERCENT: 5 %
NEUTROPHILS ABSOLUTE COUNT: 4.9 10*9/L (ref 1.8–7.8)
NEUTROPHILS RELATIVE PERCENT: 60.2 %
PLATELET COUNT: 298 10*9/L (ref 150–450)
RED BLOOD CELL COUNT: 4.76 10*12/L (ref 3.95–5.13)
RED CELL DISTRIBUTION WIDTH: 14.3 % (ref 12.2–15.2)
WBC ADJUSTED: 8.2 10*9/L (ref 3.6–11.2)

## 2023-02-03 LAB — URINALYSIS WITH MICROSCOPY WITH CULTURE REFLEX PERFORMABLE
BILIRUBIN UA: NEGATIVE
BLOOD UA: NEGATIVE
GLUCOSE UA: NEGATIVE
KETONES UA: NEGATIVE
LEUKOCYTE ESTERASE UA: NEGATIVE
NITRITE UA: NEGATIVE
PH UA: 5.5 (ref 5.0–9.0)
PROTEIN UA: NEGATIVE
RBC UA: <1 /HPF (ref <=4)
SPECIFIC GRAVITY UA: 1.011 (ref 1.003–1.030)
SQUAMOUS EPITHELIAL: 6 /HPF — ABNORMAL HIGH (ref 0–5)
UROBILINOGEN UA: <2
WBC UA: 2 /HPF (ref 0–5)

## 2023-02-03 LAB — BASIC METABOLIC PANEL
ANION GAP: 6 mmol/L (ref 5–14)
BLOOD UREA NITROGEN: 5 mg/dL — ABNORMAL LOW (ref 9–23)
BUN / CREAT RATIO: 8
CALCIUM: 9.5 mg/dL (ref 8.7–10.4)
CHLORIDE: 105 mmol/L (ref 98–107)
CO2: 27 mmol/L (ref 20.0–31.0)
CREATININE: 0.64 mg/dL
EGFR CKD-EPI (2021) FEMALE: >90 mL/min/{1.73_m2} (ref >=60)
GLUCOSE RANDOM: 85 mg/dL (ref 70–179)
POTASSIUM: 3.2 mmol/L — ABNORMAL LOW (ref 3.4–4.8)
SODIUM: 138 mmol/L (ref 135–145)

## 2023-02-03 LAB — HCG QUANTITATIVE, BLOOD: GONADOTROPIN, CHORIONIC (HCG) QUANT: 26927.6 m[IU]/mL

## 2023-02-03 NOTE — Unmapped (Signed)
Hx of BTL  LMP 12/27/22  Denies pain.   Ectopic precautions given.   [facility] clinic contacted.   Per Dr. Ancil Linsey- pt to ED at this time. Patient in agreement.

## 2023-02-04 ENCOUNTER — Telehealth
Admit: 2023-02-04 | Discharge: 2023-02-05 | Payer: PRIVATE HEALTH INSURANCE | Attending: Obstetrics & Gynecology | Primary: Obstetrics & Gynecology

## 2023-02-04 DIAGNOSIS — Z332 Encounter for elective termination of pregnancy: Principal | ICD-10-CM

## 2023-02-04 NOTE — Unmapped (Signed)
Pt ambulatory to triage, reports feeling poorly the past couple days, run down, some nausea. She noted that her period is late so she took a home preg test and it was positive. She has tubal ligation, has had this happen before -- had to have a D&C x2 the last time this happened in 2023. She called her OB office, they told her to come to ED for possible ectopic. No pain at present.

## 2023-02-04 NOTE — Unmapped (Signed)
OB/GYN Consult Note    Requesting Service:  Emergency Medicine  Requesting Attending: Duwaine Maxin, MD  Reason for Consult: Undesired Pregnancy    ASSESSMENT & RECOMMENDATIONS     Madison Peterson is a 37 y.o. [phone] presenting with an undesired intrauterine pregnancy at EGA [redacted] weeks desiring termination.    Pre-operative planning:  Patient given time and place to return for abortion procedure in 72 hours.  Miso indicated: no  Bereavement Plan:   n/a  Further testing/genetic testing:  none  Rh Status: A POS  Rhogam was administered: no  Hgb: 12.6    Procedure counseling/informed consent process:  The risks, benefits, and alternatives to uterine aspiration were discussed.  Procedure risks including but not limited to hemorrhage, infection, perforation, retained products of conception, possible diagnostic laparascopy, possible laparotomy, and possible hysterectomy and even, rarely, death, were reviewed. The patient voiced understanding and wishes to proceed with standard uterine aspiration. Surgical consents not signed.    Patient was provided  at least 72 hours in advance with the full name of the physician performing the surgical abortion and specific information for the physician's hospital admitting privileges and whether the procedure is covered by the patient's insurance.    State required consent was reviewed and signed. Scanned into media.   Will message [facility] to arrange scheduling.    The patient is also an appropriate candidate for a medication abortion, which we also discussed. We reviewed risks of medication abortion including pain, bleeding, infection, and the possibility of medication failure requiring surgical management. Though at this time she prefers surgical abortion, she is aware that medication abortion is an option. All patient questions were answered. State-mandated consent forms for medication abortion were reviewed at length and signed in case the patient decides she prefers this option.    Patient was screened for coercion and abuse, which was negative.    Contraception Counseling:  We reviewed the risks and benefits of all available contraceptive methods including hormonal, barrier, and devices. Following this discussion patient would like to proceed with laparoscopic salpingectomy. Patient previously counseled, [facility] discussed having [facility] perform procedure given her adhesive disease.     Will place referral to [facility].     Discussed with attending Dr. Rodena Medin who is in agreement with the assessment and plan.    Thank you for the opportunity to participate in the care of this patient. Please page the OB/GYN consult service at [phone] with any questions or concerns.    HISTORY     Chief Complaint:   Chief Complaint   Patient presents with    Pregnancy Problem       History of Present Illness:  Madison Peterson is a 37 y.o. [phone] who presents with positive home pregnancy test with history of bilateral tubal ligation with Filshie clips.     She had a tubal ligation with filshie clips in 2019, had a subsequent pregnancy in 2023. Had a D&C followed by a Hysteroscopic D&C for retained products. She just had another positive UPT today.  LMP 7/22.     This is an unplanned and undesired pregnancy. She would like surgical termination.     History of CS x 3     - 09/29/2021: Diagnostic D&C for plateauing pregnacy   - 10/01/21 HSC D&C for retained POCs after D&C     Past Medical History:  Past Medical History:   Diagnosis Date    Bruise 03/29/2018    Cellulitis, wound, post-operative  History of pre-eclampsia in prior pregnancy, currently pregnant 2014    2nd preg; IV MgSO4; del at 36 weeks    HTN in pregnancy, chronic     Medical history reviewed with no changes 04/21/2018    per pt    Neck pain on right side 09/29/2017    Obesity     Sickle cell trait (CMS-HCC)        Past Surgical History:  Past Surgical History:   Procedure Laterality Date    CESAREAN SECTION  2012, 2014    PR CESAREAN DELIVERY ONLY N/A 05/19/2017    Procedure: REPEAT C/S;  Surgeon: Carrolyn Leigh, MD;  Location: L&D C-SECTION OR SUITES [facility];  Service: Maternal-Fetal Medicine    PR DILATION/CURETTAGE,DIAGNOSTIC N/A 09/28/2021    Procedure: DILATION AND CURETTAGE, DIAGNOSTIC AND/OR THERAPEUTIC (NON OBSTETRICAL);  Surgeon: Nelle Don, MD;  Location: MAIN OR [facility];  Service: [facility] Primary Gynecology    PR HYSTEROSCOPY,W/ENDO BX N/A 10/01/2021    Procedure: HYSTEROSCOPY, SURGICAL; WITH SAMPLING (BIOPSY) OF ENDOMETRIUM &/OR POLYPECTOMY, W/WO D&C;  Surgeon: Lamar Benes, MD;  Location: MAIN OR [facility];  Service: [facility]omens Primary Gynecology    PR LAP,TUBAL BLOCK BY DEVICE Bilateral 04/26/2018    Procedure: LAPAROSCOPY, SURGICAL; WITH OCCLUSION OF OVIDUCTS BY DEVICE (EG, BAND, CLIP, OR FALOPE RING);  Surgeon: Estil Daft, MD;  Location: [facility] OR [facility];  Service: [facility] Primary Gynecology    WISDOM TOOTH EXTRACTION         Obstetric History:  OB History   Gravida Para Term Preterm AB Living   5 3 2 1   3    SAB IAB Ectopic Molar Multiple Live Births           0 3      # Outcome Date GA Lbr Len/2nd Weight Sex Type Anes PTL Lv   5 Current            4 Term 05/19/17 [redacted]w[redacted]d  3445 g (7 lb 9.5 oz) F CS-LTranv Spinal, EPI N LIV      Birth Comments: Repeat   3 Preterm 06/14/12 [redacted]w[redacted]d   F CS-LTranv Spinal, EPI  LIV      Birth Comments: Repeat      Complications: Severe Preeclampsia   2 Term 06/20/10 [redacted]w[redacted]d  3118 g (6 lb 14 oz) F CS-LTranv   LIV      Complications: Fetal Intolerance, Gestational hypertension, antepartum   1 Gravida               Obstetric Comments   GYN HISTORY:    Patient's last menstrual period was 08/30/2016.   Last pap: pap: 2018; NIL   History of abnormal Pap: No previous abnormalities   STI history: The patient denies history of sexually transmitted disease.   History of abuse: Denied any abuse history      OB-History reviewed and updated by Ervin Knack on 03/21/2018 for pregnancy dated 06/20/2010 using [employer] tab; Attachment date: 12/29/2016; Doc. Type: External Records; Description: OB/GYN [employer] [facility].       OB-History reviewed and updated by Ervin Knack on 03/21/2018 for pregnancy dated 06/14/2012 using [employer] tab; Attachment date: 03/29/2017; Doc type: External Record;  Description: [facility].       OB-History reviewed and updated by Ervin Knack on 03/21/2018 for pregnancy dated 05/19/2017 using [place] Epic OP Note / L&D Note located under Chart Review/Note tab.  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     Employer: [employer]   Tobacco Use    Smoking status: Never     Passive exposure: Never    Smokeless tobacco: Never   Vaping Use    Vaping status: Never Used   Substance and Sexual Activity    Alcohol use: Yes     Comment: 2-3    Drug use: No    Sexual activity: Yes     Partners: Male     Birth control/protection: None     Social Determinants of Health     Financial Resource Strain: Patient Declined (05/07/2022)    Overall Financial Resource Strain (CARDIA)     Difficulty of Paying Living Expenses: Patient declined   Food Insecurity: Patient Declined (05/07/2022)    Hunger Vital Sign     Worried About Running Out of Food in the Last Year: Patient declined     Barista in the Last Year: Patient declined   Transportation Needs: Patient Declined (05/07/2022)    PRAPARE - Therapist, art (Medical): Patient declined     Lack of Transportation (Non-Medical): Patient declined       Family History:  Family History   Problem Relation Age of Onset    Diabetes Mother     Hypertension Mother     Kidney disease Mother         diagnosed years ago    Hypertension Father     No Known Problems Sister     No Known Problems Brother     No Known Problems Maternal Aunt     No Known Problems Maternal Uncle     No Known Problems Paternal Aunt     No Known Problems Paternal Uncle     No Known Problems Maternal Grandmother     No Known Problems Maternal Grandfather     No Known Problems Paternal Grandmother     No Known Problems Paternal Grandfather     No Known Problems Other     Anesthesia problems Neg Hx     Broken bones Neg Hx     Cancer Neg Hx     Clotting disorder Neg Hx     Collagen disease Neg Hx     Dislocations Neg Hx     Fibromyalgia Neg Hx     Gout Neg Hx     Hemophilia Neg Hx     Osteoporosis Neg Hx     Rheumatologic disease Neg Hx     Scoliosis Neg Hx     Severe sprains Neg Hx     Sickle cell anemia Neg Hx     Spinal Compression Fracture Neg Hx         Home Medications:  No current facility-administered medications on file prior to encounter.     Current Outpatient Medications on File Prior to Encounter   Medication Sig    cyclobenzaprine (FLEXERIL) 10 MG tablet Take 1 tablet (10 mg total) by mouth nightly as needed for muscle spasms for up to 24 doses.    hydroCHLOROthiazide 12.5 MG tablet Take 1 tablet (12.5 mg total) by mouth daily.    hydrOXYzine (ATARAX) 25 MG tablet Take 1 tablet (25 mg total) by mouth every eight (8) hours  as needed.    lisinopriL (PRINIVIL,ZESTRIL) 10 MG tablet Take 1 tablet (10 mg total) by mouth daily.    semaglutide, weight loss, (WEGOVY) 1 mg/0.5 mL injection pen Inject 1 mg under the skin every seven (7) days.    topiramate (TOPAMAX) 25 MG tablet Take 2 tablets (50 mg total) by mouth daily.       Allergies:   No Known Allergies    Review of Systems: As per HPI, otherwise negative for balance of 10 systems.    PHYSICAL EXAM     BP 137/90  - Pulse 86  - Temp 37 ??C (98.6 ??F) (Oral)  - Resp 16  - Wt 81.6 kg (180 lb)  - LMP 09/08/2022 (Approximate)  - SpO2 99%  - BMI 32.92 kg/m??     Constitutional: No apparent distress.  CV: Normal rate.    Pulm: Normal work of breathing.   Abdominal: Soft or not TTP.   Genitourinary: Deferred  Extremities: No edema or calf tenderness bilaterally.  Neurological: She is alert and conversational.   Skin: Skin is warm and dry. No rash noted.   Psychiatric: Normal mood and affect.     LABS and IMAGING     Recent Results (from the past 24 hour(s))   Basic metabolic panel    Collection Time: 02/03/23  7:04 PM   Result Value Ref Range    Sodium 138 135 - 145 mmol/L    Potassium 3.2 (L) 3.4 - 4.8 mmol/L    Chloride 105 98 - 107 mmol/L    CO2 27.0 20.0 - 31.0 mmol/L    Anion Gap 6 5 - 14 mmol/L    BUN 5 (L) 9 - 23 mg/dL    Creatinine 0.96 0.45 - 1.02 mg/dL    BUN/Creatinine Ratio 8     eGFR CKD-EPI (2021) Female >90 >=60 mL/min/1.68m2    Glucose 85 70 - 179 mg/dL    Calcium 9.5 8.7 - 40.9 mg/dL   hCG, Quantitative, Pregnancy    Collection Time: 02/03/23  7:04 PM   Result Value Ref Range    hCG Quantitative 26,927.6 mIU/mL   Type and Screen    Collection Time: 02/03/23  7:04 PM   Result Value Ref Range    ABO Grouping A POS     Antibody Screen NEG    CBC w/ Differential    Collection Time: 02/03/23  7:04 PM   Result Value Ref Range    WBC 8.2 3.6 - 11.2 10*9/L    RBC 4.76 3.95 - 5.13 10*12/L    HGB 12.6 11.3 - 14.9 g/dL    HCT 81.1 91.4 - 78.2 %    MCV 78.2 77.6 - 95.7 fL    MCH 26.4 25.9 - 32.4 pg    MCHC 33.8 32.0 - 36.0 g/dL    RDW 95.6 21.3 - 08.6 %    MPV 7.7 6.8 - 10.7 fL    Platelet 298 150 - 450 10*9/L    Neutrophils % 60.2 %    Lymphocytes % 32.7 %    Monocytes % 5.0 %    Eosinophils % 1.5 %    Basophils % 0.6 %    Absolute Neutrophils 4.9 1.8 - 7.8 10*9/L    Absolute Lymphocytes 2.7 1.1 - 3.6 10*9/L    Absolute Monocytes 0.4 0.3 - 0.8 10*9/L    Absolute Eosinophils 0.1 0.0 - 0.5 10*9/L    Absolute Basophils 0.0 0.0 - 0.1 10*9/L    Microcytosis Slight (A)  Not Present    Hypochromasia Slight (A) Not Present   Urinalysis with Microscopy with Culture Reflex    Collection Time: 02/03/23  7:04 PM   Result Value Ref Range    Color, UA Light Yellow     Clarity, UA Turbid     Specific Gravity, UA 1.011 1.003 - 1.030    pH, UA 5.5 5.0 - 9.0    Leukocyte Esterase, UA Negative Negative    Nitrite, UA Negative Negative    Protein, UA Negative Negative    Glucose, UA Negative Negative    Ketones, UA Negative Negative    Urobilinogen, UA <2.0 mg/dL <1.6 mg/dL    Bilirubin, UA Negative Negative    Blood, UA Negative Negative    RBC, UA <1 <=4 /HPF    WBC, UA 2 0 - 5 /HPF    Squam Epithel, UA 6 (H) 0 - 5 /HPF    Bacteria, UA Rare (A) None Seen /HPF    Mucus, UA Rare (A) None Seen /HPF       US Pelvic (Transvaginal) - PREGNANCY RELATED    Result Date: 02/03/2023  EXAM: [place] [facility] TRANSVAGINAL ACCESSION: [id number] UN CLINICAL INDICATION: ectopic pregnancy evaluation -  positive pregnancy with hx of tubal ligation  LMP: 12/27/22 HCG February 03, 2023, 26,927. COMPARISON: None TECHNIQUE: Ultrasound views of the pelvis were obtained endovaginally and transabdominally using gray scale and color Doppler imaging. Spectral Doppler was also performed. FINDINGS: There was a single live intrauterine pregnancy.       Cardiac activity yes      FHR: 182 Cardiac activity was seen.. It was too early to evaluate placental location or measure amniotic fluid index. Yolk sac identified.      Yolk sac 0.33 cm Crown rump length of 0.22 cm corresponded with an estimated gestational age of [redacted] weeks 3 days. Gestational sac measured 1.47 cm. UTERUS: The uterus measures 9.3 x 6.7 x 6.3 cm. The endometrium was difficult to discretely measure. OVARIES: The right ovary is seen transvaginally with 2.2 cm presumed corpus luteum. The left ovary is not definitively well visualized, although it contains some flow. Appropriate arterial inflow and venous outflow was seen in both ovaries with color and spectral doppler.      Right ovary: 3.7 x 2.1 x 2.2 cm      Left ovary: 1.5 x 1.3 x 2.1 cm OTHER: No abnormal pelvic free fluid. Please see below for data measurements: Yolk sac 0.33 cm Gestational sac 1.47 cm Crown rump 0.22 cm Cardiac activity yes FHR: 182     Single live intrauterine pregnancy with an estimated gestational age of [redacted] weeks 3 days.

## 2023-02-04 NOTE — Unmapped (Signed)
Patient recently had positive home pregnancy test (LMP 7/22). Patient's tubes are tied. No pain, no bleeding

## 2023-02-04 NOTE — Unmapped (Signed)
[facility] Note - Telemed Appointment    ASSESSMENT/PLAN  Madison Peterson is a 37 y.o. female [phone]  6+4w here for unplanned pregnancy (V61.7). Presents today for pre-operative visit.  -For OR procedure on 9/3, informed of OR protocol, 2h arrival time, and no food/water after midnight    Pre-operative planning:  Patient given time and place to return for abortion procedure in 72 hours.  Miso indicated: no  Bereavement Plan:    none  Further testing/genetic testing:  none  Rh Status: A POS  Rhogam was administered: no  Hgb: 12.6    Procedure counseling/informed consent process:  The risks, benefits, and alternatives to uterine aspiration were discussed.  Procedure risks including but not limited to hemorrhage, infection, perforation, retained products of conception, possible diagnostic laparascopy, possible laparotomy, and possible hysterectomy and even, rarely, death, were reviewed. The patient voiced understanding and wishes to proceed with standard uterine aspiration.    Patient was provided  at least 72 hours in advance with the full name of the physician performing the surgical abortion and specific information for the physician's hospital admitting privileges and whether the procedure is covered by the patient's insurance.    State required consent was reviewed and signed.    Patient was screened for coercion and abuse, which was negative.    Contraception Counseling:  We reviewed the risks and benefits of all available contraceptive methods including hormonal, barrier, and devices. Following this discussion patient would like time to consider options.     SUBJECTIVE  CC: undesired pregnancy  HPI: Madison Peterson is a 37 y.o. female is a [id number] at 6+4w by sono (8/29).     Relevant GYNHX:  Prior Gyn operations: d&c  Denies fibroids, ov cysts, abnml paps, STIs     OBHX:   OB History       Gravida   5    Para   3    Term   2    Preterm   1    AB   1    Living   3         SAB   1    IAB Ectopic        Molar        Multiple   0    Live Births   3          Obstetric Comments   GYN HISTORY:   Patient's last menstrual period was 08/30/2016.  Last pap: pap: 2018; NIL  History of abnormal Pap: No previous abnormalities  STI history: The patient denies history of sexually transmitted disease.  History of abuse: Denied any abuse his tory    OB-History reviewed and updated by Ervin Knack on 03/21/2018 for pregnancy dated 06/20/2010 using [employer] tab; Attachment date: 12/29/2016; Doc. Type: External Records; Description: OB/GYN [employer] ... [facility].     OB-History  reviewed and updated by Ervin Knack on 03/21/2018 for pregnancy dated 06/14/2012 using [employer] tab; Attachment date: 03/29/2017; Doc type: External Record;  Description: [facility].     OB-History reviewed and updated by Ervin Knack on  03/21/2018 for pregnancy dated 05/19/2017 using [place] Epic OP Note / L&D Note located under Chart Review/Note tab.              PMH:   Past Medical History:   Diagnosis Date    Bruise 03/29/2018    Cellulitis, wound, post-operative     History  of pre-eclampsia in prior pregnancy, currently pregnant 2014    2nd preg; IV MgSO4; del at 36 weeks    HTN in pregnancy, chronic     Hypertension     Medical history reviewed with no changes 04/21/2018    per pt    Neck pain on right side 09/29/2017    Obesity     Sickle cell trait (CMS-HCC)      PSURGHX:   Past Surgical History:   Procedure Laterality Date    CESAREAN SECTION  2012, 2014    PR CESAREAN DELIVERY ONLY N/A 05/19/2017    Procedure: REPEAT C/S;  Surgeon: Carrolyn Leigh, MD;  Location: L&D C-SECTION OR SUITES [facility];  Service: Maternal-Fetal Medicine    PR DILATION/CURETTAGE,DIAGNOSTIC N/A 09/28/2021    Procedure: DILATION AND CURETTAGE, DIAGNOSTIC AND/OR THERAPEUTIC (NON OBSTETRICAL);  Surgeon: Nelle Don, MD;  Location: MAIN OR [facility];  Service: [facility] Primary Gynecology    PR HYSTEROSCOPY,W/ENDO BX N/A 10/01/2021    Procedure: HYSTEROSCOPY, SURGICAL; WITH SAMPLING (BIOPSY) OF ENDOMETRIUM &/OR POLYPECTOMY, W/WO D&C;  Surgeon: Lamar Benes, MD;  Location: MAIN OR [facility];  Service: [facility] Primary Gynecology    PR LAP,TUBAL BLOCK BY DEVICE Bilateral 04/26/2018    Procedure: LAPAROSCOPY, SURGICAL; WITH OCCLUSION OF OVIDUCTS BY DEVICE (EG, BAND, CLIP, OR FALOPE RING);  Surgeon: Estil Daft, MD;  Location: [facility] OR [facility];  Service: [facility] Primary Gynecology    WISDOM TOOTH EXTRACTION       MEDS: Lisinopril  ALLERGIES:has No Known Allergies.    OBJECTIVE  LMP 09/08/2022 (Approximate)    Gen: well-appearing, alert and oriented x 2; well-developed, well-nourished female in NAD  Pulm: no SOB  Neuro: grossly intact  Limited due to telemedeicine      Ultrasound: Ultrasound confirms a singleton intrauterine pregnancy at 6+3 weeks (8/29) in ED    Williemae Area, MD PGY5      The patient reports they are physically located in [place] and is currently: at home. I conducted a audio/video visit. I spent  98m 35s on the video call with the patient. I spent an additional 15 minutes on pre- and post-visit activities on the date of service .

## 2023-02-04 NOTE — Unmapped (Signed)
Emergency Department Provider Note        ED Clinical Impression     Final diagnoses:   Incidental intrauterine pregnancy (Primary)       ED Assessment/Plan       History     Chief Complaint   Patient presents with    Pregnancy Problem     Patient is a 37 y.o. [id number] s/p tubal ligation in 2019 presenting after getting a positive home pregnancy test. LMP 12/27/2022. Patient with the same presentation in 2023 and needed a D&C and hysteroscopy. Currently patient denies pelvic/abdominal pain, denies vaginal bleeding/discharge, fevers. Endorses occasional chills since yesterday and two brief episodes of L hip pain radiating up her left side while in the waiting room but nothing since being roomed. ROS also positive for nausea without vomiting and decreased appetite.      Pregnancy Problem  Associated symptoms include chills and nausea. Pertinent negatives include no abdominal pain, fever or vomiting.       Past Medical History:   Diagnosis Date    Bruise 03/29/2018    Cellulitis, wound, post-operative     History of pre-eclampsia in prior pregnancy, currently pregnant 2014    2nd preg; IV MgSO4; del at 36 weeks    HTN in pregnancy, chronic     Medical history reviewed with no changes 04/21/2018    per pt    Neck pain on right side 09/29/2017    Obesity     Sickle cell trait (CMS-HCC)        Past Surgical History:   Procedure Laterality Date    CESAREAN SECTION  2012, 2014    PR CESAREAN DELIVERY ONLY N/A 05/19/2017    Procedure: REPEAT C/S;  Surgeon: Carrolyn Leigh, MD;  Location: L&D C-SECTION OR SUITES [facility];  Service: Maternal-Fetal Medicine    PR DILATION/CURETTAGE,DIAGNOSTIC N/A 09/28/2021    Procedure: DILATION AND CURETTAGE, DIAGNOSTIC AND/OR THERAPEUTIC (NON OBSTETRICAL);  Surgeon: Nelle Don, MD;  Location: MAIN OR [facility];  Service: [facility] Primary Gynecology    PR HYSTEROSCOPY,W/ENDO BX N/A 10/01/2021    Procedure: HYSTEROSCOPY, SURGICAL; WITH SAMPLING (BIOPSY) OF ENDOMETRIUM &/OR POLYPECTOMY, W/WO D&C;  Surgeon: Lamar Benes, MD;  Location: MAIN OR [facility];  Service: Womens Primary Gynecology    PR LAP,TUBAL BLOCK BY DEVICE Bilateral 04/26/2018    Procedure: LAPAROSCOPY, SURGICAL; WITH OCCLUSION OF OVIDUCTS BY DEVICE (EG, BAND, CLIP, OR FALOPE RING);  Surgeon: Estil Daft, MD;  Location: [facility] OR [facility];  Service: [facility] Primary Gynecology    WISDOM TOOTH EXTRACTION         Family History   Problem Relation Age of Onset    Diabetes Mother     Hypertension Mother     Kidney disease Mother         diagnosed years ago    Hypertension Father     No Known Problems Sister     No Known Problems Brother     No Known Problems Maternal Aunt     No Known Problems Maternal Uncle     No Known Problems Paternal Aunt     No Known Problems Paternal Uncle     No Known Problems Maternal Grandmother     No Known Problems Maternal Grandfather     No Known Problems Paternal Grandmother     No Known Problems Paternal Grandfather     No Known Problems Other     Anesthesia problems Neg Hx     Broken bones Neg Hx  Cancer Neg Hx     Clotting disorder Neg Hx     Collagen disease Neg Hx     Dislocations Neg Hx     Fibromyalgia Neg Hx     Gout Neg Hx     Hemophilia Neg Hx     Osteoporosis Neg Hx     Rheumatologic disease Neg Hx     Scoliosis Neg Hx     Severe sprains Neg Hx     Sickle cell anemia Neg Hx     Spinal Compression Fracture Neg Hx        Social History     Socioeconomic History    Marital status: Single     Spouse name: Copy    Number of children: 3    Years of education: None    Highest education level: None   Occupational History    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     Employer: [employer]   Tobacco Use    Smoking status: Never     Passive exposure: Never    Smokeless tobacco: Never   Vaping Use    Vaping status: Never Used   Substance and Sexual Activity    Alcohol use: Yes     Comment: 2-3    Drug use: No    Sexual activity: Yes     Partners: Male     Birth control/protection: None     Social Determinants of Health     Financial Resource Strain: Patient Declined (05/07/2022)    Overall Financial Resource Strain (CARDIA)     Difficulty of Paying Living Expenses: Patient declined   Food Insecurity: Patient Declined (05/07/2022)    Hunger Vital Sign     Worried About Running Out of Food in the Last Year: Patient declined     Ran Out of Food in the Last Year: Patient declined   Transportation Needs: Patient Declined (05/07/2022)    PRAPARE - Therapist, art (Medical): Patient declined     Lack of Transportation (Non-Medical): Patient declined       Review of Systems   Constitutional:  Positive for chills. Negative for appetite change and fever.   HENT: Negative.     Eyes: Negative.    Respiratory: Negative.     Cardiovascular: Negative.    Gastrointestinal:  Positive for nausea. Negative for abdominal pain and vomiting.   Endocrine: Negative.    Genitourinary:  Negative for dysuria, hematuria, pelvic pain, urgency, vaginal bleeding and vaginal pain.   Musculoskeletal: Negative.    Skin: Negative.    Allergic/Immunologic: Negative.    Neurological: Negative.    Hematological: Negative.    Psychiatric/Behavioral: Negative.         Physical Exam     BP 137/90  - Pulse 86  - Temp 37 ??C (98.6 ??F) (Oral)  - Resp 16  - Wt 81.6 kg (180 lb)  - LMP 09/08/2022 (Approximate)  - SpO2 99%  - BMI 32.92 kg/m??     Physical Exam    ED Course            Medical Decision Making  Pregnancy, r/o ectopic. Transvaginal US confirms intrauterine pregnancy. Ordered labs: CBC, BMP, UA, hCG quant, type and screen. Consult to OBGYN, who will counsel/consent before discharge.            Roosvelt Maser I, MD  Resident  02/03/23 [phone]

## 2023-02-08 ENCOUNTER — Ambulatory Visit: Admit: 2023-02-08 | Discharge: 2023-02-08 | Payer: PRIVATE HEALTH INSURANCE

## 2023-02-08 ENCOUNTER — Encounter: Admit: 2023-02-08 | Discharge: 2023-02-08 | Payer: PRIVATE HEALTH INSURANCE

## 2023-02-08 MED ORDER — FAMOTIDINE 20 MG TABLET
ORAL_TABLET | Freq: Every day | ORAL | 1 refills | 30 days | Status: CP
Start: 2023-02-08 — End: 2024-02-08

## 2023-02-08 MED ADMIN — lidocaine (XYLOCAINE) 10 mg/mL (1 %) injection: @ 13:00:00 | Stop: 2023-02-08

## 2023-02-08 MED ADMIN — Propofol (DIPRIVAN) injection: INTRAVENOUS | @ 13:00:00 | Stop: 2023-02-08

## 2023-02-08 MED ADMIN — midazolam (VERSED) injection: INTRAVENOUS | @ 13:00:00 | Stop: 2023-02-08

## 2023-02-08 MED ADMIN — ondansetron (ZOFRAN) injection: INTRAVENOUS | @ 13:00:00 | Stop: 2023-02-08

## 2023-02-08 MED ADMIN — azithromycin (ZITHROMAX) tablet 500 mg: 500 mg | ORAL | @ 12:00:00 | Stop: 2023-02-08

## 2023-02-08 MED ADMIN — dexAMETHasone (DECADRON) 4 mg/mL injection: INTRAVENOUS | @ 13:00:00 | Stop: 2023-02-08

## 2023-02-08 MED ADMIN — ketorolac (TORADOL) injection: INTRAVENOUS | @ 13:00:00 | Stop: 2023-02-08

## 2023-02-08 MED ADMIN — lactated Ringers infusion: 10 mL/h | INTRAVENOUS | @ 12:00:00 | Stop: 2023-02-08

## 2023-02-08 MED ADMIN — lidocaine (PF) (XYLOCAINE-MPF) 20 mg/mL (2 %) injection: INTRAVENOUS | @ 13:00:00 | Stop: 2023-02-08

## 2023-02-08 NOTE — Unmapped (Signed)
OPERATIVE NOTE FOR STANDARD UTERINE VACUUM ASPIRATION    Patient: Madison Peterson  Medical Record Number: [id number]  Date of Surgery: 02/08/23  Surgeon: Cheryll Cockayne, MD, MPH (Attending Physician)  Assistant(s): Williemae Area, MD (Family Planning Fellow), Sweet Mancel Parsons, MD (Resident Physician)    Procedure: Standard Uterine Vacuum Aspiration     Indications:  [name] is a 37 y.o. [phone] at 7 wks who desires uterine vacuum aspiration for   pregnancy termination    For contraception, patient desires permanent contraception    Anesthesia: MAC and paracervical block with 20 mL of 1% lidocaine    IVF:  UOP: 20mL  EBL: 20mL    Findings:  1. Gritty texture of uterus following procedure  2. Products of conception consistent with gestational age seen on gross examination of specimen    Details: The patient received azithromycin for surgical prophylaxis in the preoperative area. She was taken to the operating room where she was placed under anesthesia without complication. She was placed in dorsal lithotomy and prepped with betadine solution. A straight catheterization was performed. She was draped in the normal sterile fashion.  A speculum was placed in the patient's vagina. A single-tooth tenaculum was then applied to the cervix. A paracervical block was then placed using 20mL of 1% lidocaine. The cervix was then dilated using Pratt dilators to a size 21 [place]. The size 7 suction curette was advanced under ultrasound guidance, and the contents of the uterus were removed.  A 7mm suction cannula was also used until a gritty texture was noted by the surgeon and a thin endometrial stripe was noted on ultrasound.      The tenaculum was removed from the anterior lip of the patient's cervix, which was noted to be hemostatic. The speculum was then removed from the patient's vagina.     The products of conception were examined and found to be consistent with a 7 week pregnancy.    The patient tolerated the procedure well.     Sponge, lap, and needle counts were correct.     The patient was taken to the recovery room in stable condition.     Complications: none     Condition: stable to RR    Specimens:  POC--> Disposal    Williemae Area, MD PGY5

## 2023-02-09 NOTE — Unmapped (Addendum)
GYN On Call Telephone Note    Patient called obstetrics on-call pager at 1900    [name] is a 37 y.o. [phone] F who is POD#0 s/p D&C for undesired pregnancy     Pt did not answer, will call back in 15-20 min, LvM with the following return precautions:     Patient should return to ED if she is soaking a pad per hour (top to bottom, side to side) for two hours or more, if she passes multiple large clots, or if she is bleeding so much that she becomes lightheaded or dizzy.  She should also present if she has uncontrolled abd/pelvic pain.     Patient called back:     Reports epigastric pain starting 45 min after ate [employer] (fries and a biscuit) -- radiates from left to right, under breasts. No chest pain or pressure. No SOB.     Otherwise has been feeling well overall- pain came on when she was outside playing with her kids. She had a bowel movement but is still having pain.     Vaginal bleeding is minimal. No lightheadedness or dizziness. No feers or chills.     Sent order in for pepcid, pt going to try pepto bismol and pepcid, rest, and will stay upright for awhile.   Return/call precautions provided as above.     Paulita Cradle, MD   Obstetrics and Gynecology PGY-2

## 2023-02-11 NOTE — Unmapped (Signed)
Patient called left message on voicemail with concerns regarding a procedure she this past week. Called patient back X2 to follow up, left message to called back

## 2023-02-17 NOTE — Unmapped (Signed)
Specialty Medication(s): YNWGNF    Madison Peterson has been dis-enrolled from the [facility] specialty pharmacy services due to a pharmacy change. The patient is now filling at [facility] .    Additional information provided to the patient: n/a    Arnold Long, PharmD  [facility] Specialty Pharmacist

## 2023-03-03 ENCOUNTER — Telehealth
Admit: 2023-03-03 | Discharge: 2023-03-04 | Payer: PRIVATE HEALTH INSURANCE | Attending: Student in an Organized Health Care Education/Training Program | Primary: Student in an Organized Health Care Education/Training Program

## 2023-03-03 DIAGNOSIS — G43009 Migraine without aura, not intractable, without status migrainosus: Principal | ICD-10-CM

## 2023-03-03 DIAGNOSIS — I10 Essential (primary) hypertension: Principal | ICD-10-CM

## 2023-03-03 DIAGNOSIS — E669 Obesity, unspecified: Principal | ICD-10-CM

## 2023-03-03 MED ORDER — SEMAGLUTIDE (WEIGHT LOSS) 1 MG/0.5 ML SUBCUTANEOUS PEN INJECTOR
SUBCUTANEOUS | 0 refills | 28 days | Status: CP
Start: 2023-03-03 — End: ?

## 2023-03-03 MED ORDER — SEMAGLUTIDE (WEIGHT LOSS) 1.7 MG/0.75 ML SUBCUTANEOUS PEN INJECTOR
SUBCUTANEOUS | 0 refills | 28 days | Status: CP
Start: 2023-03-03 — End: ?

## 2023-03-03 MED ORDER — SEMAGLUTIDE (WEIGHT LOSS) 0.5 MG/0.5 ML SUBCUTANEOUS PEN INJECTOR
SUBCUTANEOUS | 0 refills | 0 days | Status: CP
Start: 2023-03-03 — End: ?

## 2023-03-03 MED ORDER — SEMAGLUTIDE (WEIGHT LOSS) 2.4 MG/0.75 ML SUBCUTANEOUS PEN INJECTOR
SUBCUTANEOUS | 0 refills | 28 days | Status: CP
Start: 2023-03-03 — End: ?

## 2023-03-11 DIAGNOSIS — E66811 Obesity, Class I, BMI 30-34.9: Principal | ICD-10-CM

## 2023-03-14 DIAGNOSIS — E66811 Obesity, Class I, BMI 30-34.9: Principal | ICD-10-CM

## 2023-03-17 ENCOUNTER — Ambulatory Visit
Admit: 2023-03-17 | Discharge: 2023-03-18 | Payer: PRIVATE HEALTH INSURANCE | Attending: Student in an Organized Health Care Education/Training Program | Primary: Student in an Organized Health Care Education/Training Program

## 2023-03-17 DIAGNOSIS — I10 Essential (primary) hypertension: Principal | ICD-10-CM

## 2023-03-17 DIAGNOSIS — E66811 Obesity, Class I, BMI 30-34.9: Principal | ICD-10-CM

## 2023-03-17 MED ORDER — HYDROCHLOROTHIAZIDE 12.5 MG TABLET
ORAL_TABLET | Freq: Every day | ORAL | 11 refills | 30 days
Start: 2023-03-17 — End: 2024-03-16

## 2023-03-17 MED ORDER — LISINOPRIL 10 MG TABLET
ORAL_TABLET | Freq: Every day | ORAL | 1 refills | 90 days
Start: 2023-03-17 — End: ?

## 2023-03-17 MED ORDER — SEMAGLUTIDE (WEIGHT LOSS) 0.5 MG/0.5 ML SUBCUTANEOUS PEN INJECTOR
SUBCUTANEOUS | 0 refills | 0 days | Status: CP
Start: 2023-03-17 — End: ?
  Filled 2023-03-22: qty 2, 28d supply, fill #0

## 2023-03-18 MED ORDER — HYDROCHLOROTHIAZIDE 12.5 MG TABLET
ORAL_TABLET | Freq: Every day | ORAL | 11 refills | 30 days | Status: CP
Start: 2023-03-18 — End: 2024-03-17

## 2023-03-18 MED ORDER — LISINOPRIL 10 MG TABLET
ORAL_TABLET | Freq: Every day | ORAL | 0 refills | 90 days | Status: CP
Start: 2023-03-18 — End: ?

## 2023-03-28 DIAGNOSIS — Z3009 Encounter for other general counseling and advice on contraception: Principal | ICD-10-CM

## 2023-04-25 MED FILL — WEGOVY 1 MG/0.5 ML SUBCUTANEOUS PEN INJECTOR: SUBCUTANEOUS | 28 days supply | Qty: 2 | Fill #0

## 2023-05-03 ENCOUNTER — Encounter: Admit: 2023-05-03 | Discharge: 2023-05-03 | Payer: BLUE CROSS/BLUE SHIELD

## 2023-05-03 ENCOUNTER — Ambulatory Visit: Admit: 2023-05-03 | Discharge: 2023-05-03 | Payer: PRIVATE HEALTH INSURANCE

## 2023-05-03 MED ORDER — OXYCODONE 5 MG TABLET
ORAL_TABLET | ORAL | 0 refills | 1 days | Status: CP | PRN
Start: 2023-05-03 — End: ?
  Filled 2023-05-03: qty 5, 1d supply, fill #0

## 2023-05-03 MED ORDER — POLYETHYLENE GLYCOL 3350 17 GRAM/DOSE ORAL POWDER
Freq: Every day | ORAL | 0 refills | 30 days | Status: CP
Start: 2023-05-03 — End: 2023-06-02
  Filled 2023-05-03: qty 510, 30d supply, fill #0

## 2023-05-03 MED ORDER — IBUPROFEN 600 MG TABLET
ORAL_TABLET | Freq: Four times a day (QID) | ORAL | 0 refills | 14 days | Status: CP | PRN
Start: 2023-05-03 — End: ?
  Filled 2023-05-03: qty 56, 14d supply, fill #0

## 2023-05-03 MED ORDER — SIMETHICONE 80 MG CHEWABLE TABLET
ORAL_TABLET | Freq: Four times a day (QID) | ORAL | 0 refills | 8 days | Status: CP | PRN
Start: 2023-05-03 — End: 2023-06-02

## 2023-05-03 MED ORDER — ACETAMINOPHEN 500 MG TABLET
ORAL_TABLET | Freq: Four times a day (QID) | ORAL | 0 refills | 14 days | Status: CP | PRN
Start: 2023-05-03 — End: ?
  Filled 2023-05-03: qty 112, 14d supply, fill #0

## 2023-05-23 DIAGNOSIS — N6323 Unspecified lump in the left breast, lower outer quadrant: Principal | ICD-10-CM

## 2023-08-24 DIAGNOSIS — N6323 Unspecified lump in the left breast, lower outer quadrant: Principal | ICD-10-CM

## 2023-08-31 ENCOUNTER — Ambulatory Visit
Admit: 2023-08-31 | Discharge: 2023-09-01 | Payer: BLUE CROSS/BLUE SHIELD | Attending: Student in an Organized Health Care Education/Training Program | Primary: Student in an Organized Health Care Education/Training Program

## 2023-08-31 DIAGNOSIS — I10 Essential (primary) hypertension: Principal | ICD-10-CM

## 2023-08-31 DIAGNOSIS — E66811 Obesity, Class I, BMI 30-34.9: Principal | ICD-10-CM

## 2023-08-31 DIAGNOSIS — G43009 Migraine without aura, not intractable, without status migrainosus: Principal | ICD-10-CM

## 2023-08-31 MED ORDER — ZEPBOUND 2.5 MG/0.5 ML SUBCUTANEOUS PEN INJECTOR
SUBCUTANEOUS | 1 refills | 28 days | Status: CP
Start: 2023-08-31 — End: 2023-09-22

## 2023-09-06 DIAGNOSIS — E66811 Obesity, Class I, BMI 30-34.9: Principal | ICD-10-CM

## 2023-09-15 NOTE — Unmapped (Signed)
 [facility] Specialty Medication Onboarding    Specialty Medication: ZEPBOUND 2.5 mg/0.5 mL injection pen (tirzepatide)  Prior Authorization: Approved   Financial Assistance: No - copay card or grant not available   Final Copay/Day Supply: $99.99 / 28    Insurance Restrictions: None     Notes to Pharmacist:   Credit Card on File: no  Start Date on Rx:      The triage team has completed the benefits investigation and has determined that the patient is able to fill this medication at [facility]. Please contact the patient to complete the onboarding or follow up with the prescribing physician as needed.

## 2023-09-21 NOTE — Unmapped (Signed)
The [facility] Home Delivery [facility] has reached out to this patient via MyChart to onboard them to our Specialty Lite services for their Zepbound. Their medication has never been filled.They will now receive proactive outreach from the pharmacy team for refills.    Windell Norfolk, PharmD  [facility] Specialty and Home Delivery Pharmacist

## 2023-09-28 MED ORDER — ZEPBOUND 5 MG/0.5 ML SUBCUTANEOUS PEN INJECTOR
SUBCUTANEOUS | 1 refills | 0 days | Status: CP
Start: 2023-09-28 — End: 2023-08-31
  Filled 2023-10-06: qty 2, 28d supply, fill #0

## 2023-10-03 ENCOUNTER — Ambulatory Visit: Admit: 2023-10-03 | Discharge: 2023-10-04 | Payer: BLUE CROSS/BLUE SHIELD

## 2023-10-03 MED ORDER — LISINOPRIL 10 MG TABLET
ORAL_TABLET | Freq: Every day | ORAL | 0 refills | 90.00000 days | Status: CP
Start: 2023-10-03 — End: ?

## 2023-10-03 MED ORDER — HYDROCHLOROTHIAZIDE 12.5 MG TABLET
ORAL_TABLET | Freq: Every day | ORAL | 0 refills | 90.00000 days | Status: CP
Start: 2023-10-03 — End: 2024-10-02

## 2023-10-03 NOTE — Unmapped (Signed)
 Assessment & Plan  Hypertension  Hypertension management ongoing with hydrochlorothiazide and lisinopril. Stabilization expected in four weeks. Risk of hypotension and dizziness noted.  - Send prescription refills for hydrochlorothiazide and lisinopril to pharmacy.  - Advise to take hydrochlorothiazide in the morning and lisinopril at bedtime.  - Instruct to periodically check blood pressure over the next four weeks.    Dizziness due to blood pressure medication  Dizziness likely from antihypertensive medication restart causing postural hypotension. Expected to improve with regimen adjustment.  - Advise to take hydrochlorothiazide in the morning and lisinopril at bedtime.    Tinnitus  -Condition has resolved      .    SUBJECTIVE    Subjective   This is a 38 y.o. female who presents with had concerns including Tinnitus (Right ear ) and Dizziness.   History of Present Illness  Madison Peterson is a 38 year old female with hypertension who presents with episodes of tinnitus and dizziness.    She experienced tinnitus over the weekend, initially noticing it on Saturday while sitting on the couch. The ringing lasted approximately ten seconds and resolved. The following day, she had a similar episode lasting about five seconds. She is unsure which ear was affected but believes it was the right ear. No recent exposure to loud noises, music, or use of earbuds except during meetings.    On the same weekend, she experienced dizziness described as feeling 'woozy' while sitting at her desk after a meeting. This occurred without any change in position. She did not experience any vertigo, nausea, or headaches during these episodes. She has a history of migraines but notes these symptoms differ from her usual migraine episodes.    She recently restarted her blood pressure medications, hydrochlorothiazide 12.5 mg and lisinopril 10 mg, both taken in the morning. She had been off these medications for an unspecified period as her blood pressure had been well-controlled. No hearing loss or recent upper respiratory infections. She has not started any new medications and is not taking any over-the-counter medications.      REVIEW OF SYSTEMS    Past medical history, medications, social history, and allergies reviewed and updated.  Remainder of systems review unremarkable         Objective     OBJECTIVE  BP 136/91  - Pulse 101  - Temp 36.2 ??C (97.1 ??F) (Temporal)  - LMP 09/08/2022 (Approximate)    Physical Examination  Vital Signs- reviewed  General appearance - alert, well appearing, and in no distress  Mental status - alert, oriented to person, place, and time  Eyes - pupils equal and reactive, extraocular eye movements intact  Ears - bilateral TM's and external ear canals normal  Mouth - mucous membranes moist  Neck - supple, no significant adenopathy  Chest - clear to auscultation, no wheezes, rales or rhonchi, symmetric air entry  Heart - normal rate, regular rhythm without murmurs, rubs, clicks or gallops  Abdomen - soft, nontender, nondistended, no masses or organomegaly  Neurological - alert, oriented, normal speech, no focal findings or movement disorder noted  Extremities - no pedal edema, no clubbing or cyanosis    Labs, Imaging, and Other Clinical Data:  I have reviewed the labs, imaging studies, and other clinical data associated with this encounter.  See Epic Labs and Imaging section for details.

## 2023-10-12 MED ORDER — ZEPBOUND 5 MG/0.5 ML SUBCUTANEOUS PEN INJECTOR
SUBCUTANEOUS | 1 refills | 0 days | Status: CP
Start: 2023-10-12 — End: 2023-11-03

## 2023-11-01 ENCOUNTER — Ambulatory Visit: Admit: 2023-11-01 | Discharge: 2023-11-02 | Payer: BLUE CROSS/BLUE SHIELD

## 2023-11-01 DIAGNOSIS — M62838 Other muscle spasm: Principal | ICD-10-CM

## 2023-11-01 MED ORDER — METHOCARBAMOL 500 MG TABLET
ORAL_TABLET | Freq: Three times a day (TID) | ORAL | 0 refills | 14.00000 days | Status: CP | PRN
Start: 2023-11-01 — End: 2023-11-15

## 2023-11-01 NOTE — Unmapped (Signed)
 [facility] [facility] CLINIC NOTE    ASSESSMENT/PLAN:    Problem List Items Addressed This Visit    None  Visit Diagnoses         Trapezius muscle spasm    -  Primary    Relevant Medications    methocarbamol (ROBAXIN) 500 MG tablet        Trapezius muscle spasm  -Start methocarbamol 500 mg three times daily as needed.  -Apply warm compress or heating pad on a low setting for 20 minutes three to four times daily as needed for upper back pain.  -Start upper back exercises daily  -Return to clinic if symptoms do not improve.    Chief Complaint   Patient presents with    Back Pain     Knots in back muscle since yesterday         SUBJECTIVE:    Madison Peterson is a 38 y.o. female with a history of   Patient Active Problem List   Diagnosis    Essential hypertension    Migraine without aura and without status migrainosus, not intractable    Obesity, Class I, BMI 30-34.9    Anxiety    Tinnitus of right ear    that presents to Urgent Care  today regarding the following issues:    #1 Intermittent upper and lower back pain due to muscle spasm for the past 2 years. Has been evaluated for this previously , was prescribed a muscle relaxant that helped. Yesterday she developed muscle spasm pain in upper back and across shoulders without new activities. Her partner massages the areas vigorously which diminishes the pain. She took Tylenol without improvement. No upper extremity weakness, numbness or tingling.      I have reviewed the patients problem list, medical history, surgical history, laboratory history and recent hospitalizations, current medications, allergies, and social history and updated them as needed.    Review of symptoms:  Negative unless  Otherwise stated in HPI.     Madison Peterson  reports that she has never smoked. She has never been exposed to tobacco smoke. She has never used smokeless tobacco.    OBJECTIVE:    VITALS:   Vitals:    11/01/23 1057   BP: 130/99   Pulse: 75   Temp: 36.4 ??C (97.5 ??F)   SpO2: 99%    Wt:   Wt Readings from Last 3 Encounters:   11/01/23 86 kg (189 lb 9.6 oz)   08/31/23 85.5 kg (188 lb 6.4 oz)   05/03/23 81 kg (178 lb 8 oz)       Gen: Well appearing, pleasant and cooperative in NAD, appears stated age  Head: Normocephalic, atraumatic  Resp: . No increased work of breathing  [id number] to palpation at the cervical paraspinal muscles and trapezius muscles bilaterally. No pain with AROM and PROM of neck.  Neuro:  A&O x 4, normal gait  Skin: Warm and dry,   Psych:  Mood is good, able to carry on normal conversation, with good eye contact, without evidence of anxiety or depression    LABS:  No results found for any visits on 11/01/23.    STUDIES:  No results found.    ___________________________________  CURRENT MEDS:  Current Outpatient Medications   Medication Sig Dispense Refill    acetaminophen (TYLENOL EXTRA STRENGTH) 500 MG tablet Take 2 tablets (1,000 mg total) by mouth every six (6) hours as needed for pain. 112 tablet 0  hydroCHLOROthiazide 12.5 MG tablet Take 1 tablet (12.5 mg total) by mouth daily. 90 tablet 0    ibuprofen (MOTRIN) 600 MG tablet Take 1 tablet (600 mg total) by mouth every six (6) hours as needed for pain. 56 tablet 0    lisinopril (PRINIVIL,ZESTRIL) 10 MG tablet Take 1 tablet (10 mg total) by mouth daily. 90 tablet 0    methocarbamol (ROBAXIN) 500 MG tablet Take 1 tablet (500 mg total) by mouth Three (3) times a day as needed for up to 14 days. 42 tablet 0     No current facility-administered medications for this visit.       ___________________________________  ALLERGIES:  No Known Allergies  ------------------------    PAST MEDICAL HISTORY:   Past Medical History:   Diagnosis Date    Bruise 03/29/2018    Cellulitis, wound, post-operative     History of pre-eclampsia in prior pregnancy, currently pregnant (HHS-HCC) 2014    2nd preg; IV MgSO4; del at 36 weeks    HTN in pregnancy, chronic (HHS-HCC)     Hypertension     Medical history reviewed with no changes 04/21/2018    per pt    Neck pain on right side 09/29/2017    Obesity     Sickle cell trait (CMS-HCC)          FMURGENTCARETRACKING       Did today's visit save an ED/Direct Admission?  yes    Follow-up as Needed       [facility]  at [facility]  [address], [place], [place] [address]  Telephone [phone]  Fax [phone]  [email or web address]

## 2023-11-01 NOTE — Unmapped (Addendum)
 It was a pleasure seeing you today  Apply warm compress or heating pad on a low setting for 20 minutes three to four times daily as needed for upper back pain.  Do upper back exercises daily  Return to clinic if your pain worsens or does not improve.

## 2023-12-23 ENCOUNTER — Encounter: Payer: Self-pay | Admitting: Advanced Practice Midwife

## 2023-12-26 NOTE — Unmapped (Addendum)
 Develooped gestational HTN and continued to struggle with BP since then. Taking hydrochlorothiazide  12.5 mg q am and lisinopril  10 mg at night. BP today and at home are slightly above diastolic goal. S/p tubal ligation.   - increase lisinopril  20 mg  - continue hydrochlorothiazide  12.5 mg  - follow up with PCP in 1 month for recheck and BMP

## 2023-12-26 NOTE — Unmapped (Signed)
 [facility]    Patient ID: Madison Peterson is a 38 y.o. female who presents for follow up of blood pressure  PCP: Elgart, Redell HERO, MD      Informant: Patient came to appointment alone.    Assessment/Plan:      Assessment & Plan  Essential hypertension  Develooped gestational HTN and continued to struggle with BP since then. Taking hydrochlorothiazide  12.5 mg q am and lisinopril  10 mg at night. BP today and at home are slightly above diastolic goal. S/p tubal ligation.   - increase lisinopril  20 mg  - continue hydrochlorothiazide  12.5 mg  - follow up with PCP in 1 month for recheck and BMP        Discussed papis due at upcoming visit.          Preventive services addressed today  Pap: deferred until later visit    -- Patient verbalized an understanding of today's assessment and recommendations, as well as the purpose of ongoing medications.    I personally spent 25 minutes face-to-face and non-face-to-face in the care of this patient, which includes all pre, intra, and post visit time on the date of service.    No follow-ups on file.    Subjective:     No chief complaint on file.      HPI  See A/P        Last 3 Home BP Readings Systolic Diastolic   01/07/2023   8:45 AM 871 mmHg 89 mmHg              ROS  As per HPI.    RELEVANT PAST MEDICAL, FAMILY, SURGICAL, SOCIAL HISTORY  Summarized above  ___________________________________  CURRENT MEDS  Current Medications[1]    ___________________________________  ALLERGIES  Allergies[2]      Objective:       Vital Signs  LMP 09/08/2022 (Approximate)      Exam  Constitutional:       Appearance: Healthy appearance. Not in distress.   Eyes:      Conjunctiva/sclera: Conjunctivae normal.   Pulmonary:      Effort: Pulmonary effort is normal.   Neurological:      General: No focal deficit present.      Mental Status: Alert.      Gait: Gait is intact.                 [1]   Current Outpatient Medications   Medication Sig Dispense Refill    acetaminophen  (TYLENOL  EXTRA STRENGTH) 500 MG tablet Take 2 tablets (1,000 mg total) by mouth every six (6) hours as needed for pain. 112 tablet 0    hydroCHLOROthiazide  12.5 MG tablet Take 1 tablet (12.5 mg total) by mouth daily. 90 tablet 0    ibuprofen  (MOTRIN ) 600 MG tablet Take 1 tablet (600 mg total) by mouth every six (6) hours as needed for pain. 56 tablet 0    lisinopril  (PRINIVIL ,ZESTRIL ) 10 MG tablet Take 1 tablet (10 mg total) by mouth daily. 90 tablet 0     No current facility-administered medications for this visit.   [2] No Known Allergies

## 2023-12-27 MED ORDER — LISINOPRIL 20 MG TABLET
ORAL_TABLET | Freq: Every day | ORAL | 3 refills | 90.00000 days | Status: CP
Start: 2023-12-27 — End: 2024-12-26

## 2024-01-12 ENCOUNTER — Ambulatory Visit
Admit: 2024-01-12 | Discharge: 2024-01-13 | Payer: BLUE CROSS/BLUE SHIELD | Attending: Student in an Organized Health Care Education/Training Program | Primary: Student in an Organized Health Care Education/Training Program

## 2024-01-12 DIAGNOSIS — I10 Essential (primary) hypertension: Principal | ICD-10-CM

## 2024-01-12 DIAGNOSIS — E66811 Obesity, Class I, BMI 30-34.9: Principal | ICD-10-CM

## 2024-01-12 MED ORDER — PHENTERMINE 15 MG CAPSULE
ORAL_CAPSULE | Freq: Every morning | ORAL | 3 refills | 30.00000 days | Status: CP
Start: 2024-01-12 — End: ?

## 2024-01-12 NOTE — Unmapped (Signed)
 [facility] [place]  [address]  [address]  [place] [address]  Phone: [phone]  Fax: [phone]    Initial Patient Evaluation - Sept 2019     Assessment/Plan:   Obesity, Class I, BMI 30-34.9  - phentermine  15 MG capsule; Take 1 capsule (15 mg total) by mouth every morning.    Essential hypertension  - phentermine  15 MG capsule; Take 1 capsule (15 mg total) by mouth every morning.    [name] is a 38 y.o.  female and BMI 34 complicated by HTN, migraine who presents for [facility] follow up. She has overall done well with interventions to date with improvement in comorbid conditions.     Treatment Course:  Weight at presentation to [facility]:  197lbs   - Current weight: 86.2 kg (190 lb)    Bariatric Surgery: Patient doesn't meet criteria for bariatric surgery with BMI 40 or more or BMI 35 or more with Co-morbidity (T2DM, OSA, HTN)    Diet Recommendations: Add more fruits and vegetables to meals, Plan ahead for healthy meals, and Reduce/eliminate processed snacks  - Applauded efforts to date, has previously worked with RD. Challenges with chocolate cravings in the evenings since being off AOM.  - Continue use of protein shakes in mornings.      Physical Activity / Exercise: adequate PA for age/risk factors  - Improving recently with use of resistance training at gym.     Sleep/Stress management: Stress level is currently feels that it is manageable   - No acute concerns.  - Pending establishing care with new therapist. Defer behavioral health referral for now, if stress related eating remains a concern will consider at next appointment.        Lab evaluation:   UTD BMP March 2025  - PCP follow up pending later this month.     Weight promoting medications:  N/A      Anti Obesity Medications:   Attempted Wegovy  and Zepbound  with significant GI side effect at lowest dosing. With this experience she is not interested in retrial at this time.     - Discussed potential Contrave, patient interested in returning to Phentermine  use given prior success. Reviewed prior use records and discussed risks/benefits today.  Her BP remains borderline but is following closely with PCP for medication adjustment and monitoring BP at home. She does not have other known contraindication.     - Start phentermine  15mg  once daily with close monitoring.  If evidence of increased BP at PCP follow up later this month, low threshold for discontinuation.     Patient is now s/p salpingectomy     Return in about 3 months (around 04/13/2024) for In-person.    Madison Elsie Candy, DO  [facility]          Subjective    [name] is a 38 y.o.  female and BMI 34 who presents for [facility] follow up.  They were last seen 5 months ago. At that time, she was started on Zepbound .     She took Zepbound  at 2.5mg  weekly x2-3 weeks and noted persistent nausea leading to discontinuation.   She returns today off therapy due to intolerance.    She has been incorporating lifestyle modification although has been frustrated by limited effect lately.     Diet:   - Stress and high sugar cravings/consumption remain a challenge.   - No longer drinking soda, has replaced with  ICE beverages.   - Significantly decreased alcohol intake, remains with wine 2x per week.     ?Exercise / Physical Activity:   Last 4-5 months significantly increased use of gym, including resistance and aerobic training with support of partner.   Recently got puppy, walking more often with him.     Sleep:   - No acute complaints.   ??  Stress:   - No acute concerns.   - Works remotely for [employer]     The patient's past medical history, medications, and drug allergies were reviewed and, as needed, updated as part of this visit.    Treatment course  Comorbid conditions include HTN, migraine    Starting weight at presentation:197lbs (BMI 37)   Weight today: 86.2 kg (190 lb)    Goal weight ~160lbs   Weight unchanged since prior visit. Total weight loss since presentation at nadir: 24lbs    Percent weight loss since presentation: 12%   Percent weight loss with phentermine /topiramate  and lifestyle modifcation: 12%    Previous and current use of anti-obesity medications:  Metformin :      yes, previously  GI upset .  Phentermine : yes, previously  Topiramate :    yes, previously    Qysmia:        no  Bupropion:      no  Naltrexone:     no     Contrave:     no  Setmelanotide: no      (McImvree)  Liraglutide:      no     (Victoza, Saxenda)  Semaglutide :  yes, restarted Sept 2024.      (Ozempic , Wegovy )  Tirzepatide :     YES - trialed April 2025. Side effect with lowest dosing.      (Mounjaro)  SGLT-2 inhibitor: no     (Farxiga, Invokana, Jardiance...)  Orlistat :no     (Xenical, Alli)  Other agents:  None known    Relevant History:  Migraines: Yes, controlled on Topiramate .   Glaucoma: no Recent exam normal   Palpitations: no  Hypertension: yes, controlled on two agents, follows with PCP.   Nephrolithiasis: no  Seizures: no  Gallbladder Disease: no     Cholecystectomy:  no  H/o pancreatitis: no     Gallstone Pancreatitis: no  Personal or family history of medullary cancer of thyroid:no  ?      Objective   ??  Current Outpatient Medications   Medication Sig Dispense Refill    acetaminophen  (TYLENOL  EXTRA STRENGTH) 500 MG tablet Take 2 tablets (1,000 mg total) by mouth every six (6) hours as needed for pain. 112 tablet 0    hydroCHLOROthiazide  12.5 MG tablet Take 1 tablet (12.5 mg total) by mouth daily. 90 tablet 0    ibuprofen  (MOTRIN ) 600 MG tablet Take 1 tablet (600 mg total) by mouth every six (6) hours as needed for pain. 56 tablet 0    lisinopril  (PRINIVIL ,ZESTRIL ) 20 MG tablet Take 1 tablet (20 mg total) by mouth daily. For high blood pressure 90 tablet 3     No current facility-administered medications for this visit.        No Known Allergies    Vital Signs  BP 128/98  - Pulse 77  - Ht 157.5 cm (5' 2.01)  - Wt 86.2 kg (190 lb)  - LMP 09/08/2022 (Approximate)  - BMI 34.74 kg/m??    Wt Readings from Last 5 Encounters:   01/12/24 86.2 kg (190 lb)   12/27/23 84.2 kg (  185 lb 9.6 oz)   11/01/23 86 kg (189 lb 9.6 oz)   08/31/23 85.5 kg (188 lb 6.4 oz)   05/03/23 81 kg (178 lb 8 oz)     Exam:  Gen: well appearing    AAO x3  HEENT: non-icteric, non-injected, EOMI  Neck: Supple, no gross LAD or thyromegaly.   Respiratory: No increased work of breathing, clear to auscultation bilaterally  CV: RRR   Gastrointestinal: no acute distress  Ext: No distal edema in lower extremities

## 2024-04-18 NOTE — Progress Notes (Signed)
 [facility]    Patient ID: Madison Peterson is a 38 y.o. female who presents for follow up of blood pressure  PCP: Elgart, Redell HERO, MD      Informant: Patient came to appointment alone.    Assessment/Plan:      Assessment & Plan  Essential hypertension  Currently taking lisinopril  20 mg. Her hydrochlorothiazide  was discontinued due to low pressures in the past per her understanding. Was started on phentermine  for weight management about two months ago (during this time she has lost 20 lbs) and has been monitoring her BP at home with several days her diastolic is in the 90s but normal systolic. Today in clinic her BP is 117/87 but she has not taken her phentermine  in 3 days. In reviewing her BP trend overall her BP has trended down with wieght loss (has lost 20 lbs!).     Discussed option to increase in lisinopril  to 40 today vs monitoring and suspect BP will continue to improve with weight loss.     Plan:  - continue lisinopril  20 mg- no changes today  - continue to monitor BP at home  - follow up in one month and if BP still elevated consider increasing lisinopril  dose                Preventive services addressed today  We did not review preventive services today    -- Patient verbalized an understanding of today's assessment and recommendations, as well as the purpose of ongoing medications.    I personally spent 30 minutes face-to-face and non-face-to-face in the care of this patient, which includes all pre, intra, and post visit time on the date of service.    No follow-ups on file.    Subjective:     Chief Complaint   Patient presents with    Follow-up     -Bp.       HPI      Has been noting that her BP has been running a bit higher at home. Particularly the diastolic number is 90-94. Her systolic numbers 879-873.    Her elevated diastolics are in the setting setting of her taking her phentermine  and today she did not take her phentermine  since Monday.         Last 3 Home BP Readings Systolic Diastolic   12/27/2023   1:34 PM 124 mmHg 96 mmHg   01/07/2023   8:45 AM 128 mmHg 89 mmHg              ROS  As per HPI.    RELEVANT PAST MEDICAL, FAMILY, SURGICAL, SOCIAL HISTORY  Summarized above  ___________________________________  CURRENT MEDS  Current Medications[1]    ___________________________________  ALLERGIES  Allergies[2]      Objective:       Vital Signs  BP 117/87 (BP Position: Sitting) Comment: Average Bp - Pulse 85  - Temp 36.6 ??C (97.9 ??F) (Temporal)  - Ht 157.5 cm (5' 2.01)  - Wt 81.6 kg (179 lb 12.8 oz)  - BMI 32.88 kg/m??      Exam  Constitutional:       Appearance: Healthy appearance. Not in distress.   Eyes:      Conjunctiva/sclera: Conjunctivae normal.   Pulmonary:      Effort: Pulmonary effort is normal.   Cardiovascular:      Normal rate. Regular rhythm. Normal S1. Normal S2.       Murmurs: There is no murmur.   Neurological:  General: No focal deficit present.      Mental Status: Alert.      Gait: Gait is intact.                 [1]   Current Outpatient Medications   Medication Sig Dispense Refill    acetaminophen  (TYLENOL  EXTRA STRENGTH) 500 MG tablet Take 2 tablets (1,000 mg total) by mouth every six (6) hours as needed for pain. 112 tablet 0    ibuprofen  (MOTRIN ) 600 MG tablet Take 1 tablet (600 mg total) by mouth every six (6) hours as needed for pain. 56 tablet 0    lisinopril  (PRINIVIL ,ZESTRIL ) 20 MG tablet Take 1 tablet (20 mg total) by mouth daily. For high blood pressure 90 tablet 3    phentermine  15 MG capsule Take 1 capsule (15 mg total) by mouth every morning. 30 capsule 3     No current facility-administered medications for this visit.   [2] No Known Allergies

## 2024-04-18 NOTE — Assessment & Plan Note (Addendum)
 Currently taking lisinopril  20 mg. Her hydrochlorothiazide  was discontinued due to low pressures in the past per her understanding. Was started on phentermine  for weight management about two months ago (during this time she has lost 20 lbs) and has been monitoring her BP at home with several days her diastolic is in the 90s but normal systolic. Today in clinic her BP is 117/87 but she has not taken her phentermine  in 3 days. In reviewing her BP trend overall her BP has trended down with wieght loss (has lost 20 lbs!).     Discussed option to increase in lisinopril  to 40 today vs monitoring and suspect BP will continue to improve with weight loss.     Plan:  - continue lisinopril  20 mg- no changes today  - continue to monitor BP at home  - follow up in one month and if BP still elevated consider increasing lisinopril  dose

## 2024-04-20 ENCOUNTER — Ambulatory Visit
Admit: 2024-04-20 | Discharge: 2024-04-21 | Payer: BLUE CROSS/BLUE SHIELD | Attending: Student in an Organized Health Care Education/Training Program | Primary: Student in an Organized Health Care Education/Training Program

## 2024-04-20 DIAGNOSIS — I10 Essential (primary) hypertension: Principal | ICD-10-CM

## 2024-04-20 DIAGNOSIS — E66811 Obesity, Class I, BMI 30-34.9: Principal | ICD-10-CM

## 2024-04-20 MED ORDER — PHENTERMINE 15 MG CAPSULE
ORAL_CAPSULE | Freq: Every morning | ORAL | 3 refills | 32.00000 days | Status: CP
Start: 2024-04-20 — End: ?

## 2024-04-20 NOTE — Patient Instructions (Addendum)
 Explore the following website for instructional exercise resources: [email or web address]/[email or web address]/  [email or web address]  [email or web address]

## 2024-04-20 NOTE — Progress Notes (Signed)
 Body composition scale used today, measurements obtained. Print out scanned into media tab.

## 2024-04-20 NOTE — Progress Notes (Signed)
 [facility] [place]  [address]  [address]  [place] [address]  Phone: [phone]  Fax: [phone]    Initial Patient Evaluation - Sept 2019     Assessment/Plan:   Obesity, Class I, BMI 30-34.9  - phentermine  15 MG capsule; Take 1 capsule (15 mg total) by mouth every morning.    Essential hypertension  - phentermine  15 MG capsule; Take 1 capsule (15 mg total) by mouth every morning.    Madison Peterson is a 38 y.o.  female and initial BMI 37 complicated by HTN, migraine who presents for [facility] follow up. She has overall done well with interventions to date with improvement in comorbid conditions.     Treatment Course:  Weight at presentation to [facility]:  197lbs  (BMI 37)   - Current weight: 80.8 kg (178 lb 3.2 oz)    - 1lb/week weight loss trend since prior visit. Did have minimally changed BF% despite weight loss consistent with less exercise. Discussed goal of maintaining exercise through winter months.     Bariatric Surgery: Patient doesn't meet criteria for bariatric surgery with BMI 40 or more or BMI 35 or more with Co-morbidity (T2DM, OSA, HTN)    Diet Recommendations: Add more fruits and vegetables to meals, Plan ahead for healthy meals, and Reduce/eliminate processed snacks  - Applauded efforts to date, has previously worked with RD.   - Prior challenges with snacking in evenings, less since starting phentermine .      Physical Activity / Exercise: PA not adequate for age/risk factors  - Prior success with commercial gym - limited over recent months since cancelling due to financial restraints. Provided resources for home programming.     Sleep/Stress management: Stress level is currently feels that it is manageable   - No acute concerns.  - Limited concern for stress related eating, continue to monitor.        Lab evaluation:   UTD BMP March 2025  - PCP follow up pending later this month.     Weight promoting medications:  N/A      Anti Obesity Medications: Attempted Wegovy  and Zepbound  with significant GI side effect at lowest dosing. With this experience she is not interested in retrial at this time.   - Good response and tolerance to phentermine  to date with overall improvement in BP. Continue Phentermine  15mg  daily. Continue BP monitoring.     Patient is now s/p salpingectomy     Return 3-4 months     [name], DO  [facility]          Subjective    Madison Peterson is a 38 y.o.  female and initial BMI 37 who presents for [facility] follow up.  They were last seen 3 months ago. At that time, she was started on phentermine .     Interval history:    - Reports taking/tolerating phentermine  15mg  daily since prior visit. Denies palpitations, insomnia with use. Has been measuring blood pressure regularly including with PCP with overall improvement.   - Notes,decreased cravings with sugar and less snacking/stress related eating in evening. Smaller portions throughout the day.   - With weight loss, notes improvement in knee discomfort.  Increase in walking up to 3x per week, but due to cost did cancel her gym membership and has no longer been engaged in resistance training.     Diet:   - 3 meals per day, intentional about small breakfast  despite limited appetite.   - No longer drinking soda, has replaced with ICE beverages.   - Significantly decreased alcohol intake from prior.     ?Exercise / Physical Activity:   - Prior success with use of  resistance and aerobic training with support of partner.   - Limited resistance training over recent months. Recently got puppy, walking more often with him.     Sleep:   - No acute complaints.   ??  Stress:   - No acute concerns.   - Works remotely for [employer]     The patient's past medical history, medications, and drug allergies were reviewed and, as needed, updated as part of this visit.    Treatment course  Comorbid conditions include HTN, migraine    Starting weight at presentation:197lbs (BMI 37) Weight today: 80.8 kg (178 lb 3.2 oz)      Goal weight ~160lbs   Weight loss since last visit: 12lbs     Total weight loss since presentation at nadir: 19lbs    Percent weight loss since presentation: 12%   Percent weight loss with phentermine /topiramate  and lifestyle modifcation: 12%    Previous and current use of anti-obesity medications:  Metformin :      yes, previously  GI upset .  Phentermine : yes, restarted Aug 2025   Topiramate :    yes, previously    Qysmia:        no  Bupropion:      no  Naltrexone:     no     Contrave:     no  Setmelanotide: no      (McImvree)  Liraglutide:      no     (Victoza, Saxenda)  Semaglutide :  yes, restarted Sept 2024. Side effect.      (Ozempic , Wegovy )  Tirzepatide :     YES - trialed April 2025. Side effect with lowest dosing.      (Mounjaro)  SGLT-2 inhibitor: no     (Farxiga, Invokana, Jardiance...)  Orlistat :no     (Xenical, Alli)  Other agents:  None known    Relevant History:  Migraines: Yes, controlled on Topiramate .   Glaucoma: no Recent exam normal   Palpitations: no  Hypertension: yes, controlled on two agents, follows with PCP.   Nephrolithiasis: no  Seizures: no  Gallbladder Disease: no     Cholecystectomy:  no  H/o pancreatitis: no     Gallstone Pancreatitis: no  Personal or family history of medullary cancer of thyroid:no  ?      Objective   ??  Current Outpatient Medications   Medication Sig Dispense Refill    acetaminophen  (TYLENOL  EXTRA STRENGTH) 500 MG tablet Take 2 tablets (1,000 mg total) by mouth every six (6) hours as needed for pain. 112 tablet 0    ibuprofen  (MOTRIN ) 600 MG tablet Take 1 tablet (600 mg total) by mouth every six (6) hours as needed for pain. 56 tablet 0    lisinopril  (PRINIVIL ,ZESTRIL ) 20 MG tablet Take 1 tablet (20 mg total) by mouth daily. For high blood pressure 90 tablet 3    phentermine  15 MG capsule Take 1 capsule (15 mg total) by mouth every morning. 30 capsule 3     No current facility-administered medications for this visit. No Known Allergies    Vital Signs  BP 118/82 (BP Site: L Arm, BP Position: Sitting, BP Cuff Size: Large)  - Pulse 81  - Resp 16  - Ht 157.5 cm (5' 2.01)  - Wt  80.8 kg (178 lb 3.2 oz)  - BMI 32.58 kg/m??    Wt Readings from Last 5 Encounters:   04/20/24 80.8 kg (178 lb 3.2 oz)   04/19/24 81.6 kg (179 lb 12.8 oz)   01/12/24 86.2 kg (190 lb)   12/27/23 84.2 kg (185 lb 9.6 oz)   11/01/23 86 kg (189 lb 9.6 oz)     Exam:  Gen: well appearing    AAO x3  HEENT: non-icteric, non-injected, EOMI  Neck: Supple, no gross LAD or thyromegaly.   Respiratory: No increased work of breathing, clear to auscultation bilaterally  CV: RRR   Gastrointestinal: no acute distress  Ext: No distal edema in lower extremities

## 2024-04-29 DIAGNOSIS — M62830 Muscle spasm of back: Principal | ICD-10-CM

## 2024-04-29 MED ORDER — CYCLOBENZAPRINE 5 MG TABLET
ORAL_TABLET | Freq: Three times a day (TID) | ORAL | 0 refills | 10.00000 days | Status: CP | PRN
Start: 2024-04-29 — End: ?

## 2024-04-29 NOTE — Progress Notes (Signed)
 [facility] ENCOUNTER        CLINICAL IMPRESSION  Final diagnoses:   None       ASSESSMENT/PLAN & ED COURSE  Assessment & Plan  Muscle spasm of back  You were seen today for muscle spasm of your upper back.  A muscle relaxer called cyclobenzaprine  has been prescribed to help treat this.  Be aware that cyclobenzaprine  can cause sedation so you should not use this medication if you are going to be driving or operating other heavy machinery.  Also avoid taking this medication with other sedating medications or alcohol.  In addition of this muscle relaxer you can use over-the-counter pain relieving medication such as Tylenol  or ibuprofen  if you tolerate those medications well.  Application of heat to the involved part of the back may help symptomatically and you can also use over-the-counter pain relieving medications like Salonpas lidocaine  patches.  Please be evaluated again for new or worsening symptoms.  Orders:    cyclobenzaprine  (FLEXERIL ) 5 MG tablet; Take 1 tablet (5 mg total) by mouth Three (3) times a day as needed for muscle spasms.         Pertinent labs & imaging results which were available during my care of the patient were reviewed by me (see chart for details).    Time seen: April 29, 2024 8:44 AM    I have reviewed the triage vital signs and the nursing notes.    CHIEF COMPLAINT    Chief Complaint   Patient presents with    Spasms     Mid/upper back. Started last night, but pain is worse today       HPI   History of Present Illness  Madison Peterson is a 38 year old female who presents with back spasms.    She experiences back spasms located in the middle of her back, predominantly on the left side. The pain is exacerbated by movement, especially when lifting her arm. She has a history of recurrent back spasms and has experienced similar episodes in the past.    Recently, she attended a parade but denies any specific activity that could have triggered the current episode. She has used Tylenol  for relief. In the past, she has used muscle relaxers, including Flexeril  and Robaxin , which have been helpful.    She uses a back massager to alleviate the spasms, but it did not provide relief this time. She woke up this morning with significant discomfort, stating 'I can't with this one.'    Her current medications include lisinopril  and Ventolin . No shortness of breath, chest pain, or abdominal pain.      ROS  10-point ROS is negative except as marked above and in HPI.    PHYSICAL EXAM      VITAL SIGNS:    Vitals:    04/29/24 0838   BP: 138/83   Pulse: 88   Resp: 20   Temp: 36.2 ??C (97.1 ??F)   SpO2: 100%       Physical Exam          General: Well developed. Well nourished. In no acute distress.  HEENT:  Normocephalic.  Atraumatic. PERRLA, EOMI.  Heart:  Regular rate and rhythm . No murmurs.  Lungs:  Lungs clear to auscultation bilaterally. No wheezes, rales, or rhonchi.  Extremities:  No edema. Peripheral pulses normal  Musculoskeletal: Lateral rotation of thoracic spine results in her experiencing discomfort in the left side of her back.  No palpable deformity or tenderness to  palpation of thoracic spine.  Skin:  Warm, dry, intact, no rashes  Neuro:  CN II-XII intact. No neurologic deficits.  Psych:  Affect normal, answers questions appropriately, eye contact good, speech clear and coherent.       Any obtained pertinent Labs & Imaging studies were reviewed. (See chart for details)    LABS  No results found for this visit on 04/29/24.    EKG    No results found for this visit on 04/29/24 (from the past 4464 hours).    RADIOLOGY    No results found.    MEDICATIONS ADMINISTERED DURING THE VISIT      PROCEDURE  Procedures    PRESCRIPTIONS THIS VISIT  New Prescriptions    No medications on file       Follow-up as Needed      PAST MEDICAL HISTORY    Past Medical History[1]    SURGICAL HISTORY    Past Surgical History[2]    CURRENT MEDICATIONS      Current Outpatient Medications:     acetaminophen  (TYLENOL  EXTRA STRENGTH) 500 MG tablet, Take 2 tablets (1,000 mg total) by mouth every six (6) hours as needed for pain., Disp: 112 tablet, Rfl: 0    ibuprofen  (MOTRIN ) 600 MG tablet, Take 1 tablet (600 mg total) by mouth every six (6) hours as needed for pain., Disp: 56 tablet, Rfl: 0    lisinopril  (PRINIVIL ,ZESTRIL ) 20 MG tablet, Take 1 tablet (20 mg total) by mouth daily. For high blood pressure, Disp: 90 tablet, Rfl: 3    phentermine  15 MG capsule, Take 1 capsule (15 mg total) by mouth every morning., Disp: 32 capsule, Rfl: 3    ALLERGIES    Allergies[3]    FAMILY HISTORY    Family History[4]    SOCIAL HISTORY  Social History[5]    I reviewed indications for follow-up and ED precautions including worsening shortness of breath, prolonged fevers, or any atypical symptoms.     The patient was given an opportunity to ask questions about the treatment plan, and all questions were addressed to the best of my ability with the information available.  Patient stable at the time of discharge          Note - This record has been created using [employer]. Chart creation errors have been sought, but may not always have been located. Such creation errors do not reflect on the standard of medical care.     Ozell JONELLE Pick, PA           [1]   Past Medical History:  Diagnosis Date    Bruise 03/29/2018    Cellulitis, wound, post-operative     History of pre-eclampsia in prior pregnancy, currently pregnant ([facility]) 2014    2nd preg; IV MgSO4; del at 36 weeks    HTN in pregnancy, chronic ([facility]-HCC)     Hypertension     Medical history reviewed with no changes 04/21/2018    per pt    Neck pain on right side 09/29/2017    Obesity     Sickle cell trait    [2]   Past Surgical History:  Procedure Laterality Date    CESAREAN SECTION  2012, 2014    PR CESAREAN DELIVERY ONLY N/A 05/19/2017    Procedure: REPEAT C/S;  Surgeon: Lamar Dasie Splinter, MD;  Location: L&D C-SECTION OR SUITES [facility];  Service: Maternal-Fetal Medicine    PR DILATION/CURETTAGE,DIAGNOSTIC N/A 09/28/2021    Procedure: DILATION AND CURETTAGE, DIAGNOSTIC AND/OR THERAPEUTIC (  NON OBSTETRICAL);  Surgeon: Hadassah Lela Buba, MD;  Location: MAIN OR [facility];  Service: [facility] Primary Gynecology    PR HYSTEROSCOPY,W/ENDO BX N/A 10/01/2021    Procedure: HYSTEROSCOPY, SURGICAL; WITH SAMPLING (BIOPSY) OF ENDOMETRIUM &/OR POLYPECTOMY, W/WO D&C;  Surgeon: Katelin Marie Zahn, MD;  Location: MAIN OR [facility];  Service: Womens Primary Gynecology    PR INDUCED ABORTN BY D&C N/A 02/08/2023    Procedure: VACUUM ASPIRATION - INDUCED;  Surgeon: Armida Greig Fellows, MD;  Location: [facility] OR [facility];  Service: Family Planning    PR LAP,LYSIS OF ADHESIONS N/A 05/03/2023    Procedure: LAPAROSCOPY SURGICAL; WITH LYSIS OF ADHESIONS (SALPINGOLYSIS, OVARIOLYSIS)(SEPARATE PROCEDURE);  Surgeon: Lorene Rocky Prior, MD;  Location: [facility] OR [facility];  Service: Advanced Laparoscopy    PR LAP,RMV  ADNEXAL STRUCTURE N/A 05/03/2023    Procedure: LAPAROSCOPY, SURGICAL; W/REMOVAL OF ADNEXAL STRUCTURES (PARTIAL OR TOTAL OOPHORECTOMY &/OR SALPINGECTOMY);  Surgeon: Hulen Delon Mascot, MD;  Location: [facility] OR [facility];  Service: Family Planning    PR LAP,TUBAL BLOCK BY DEVICE Bilateral 04/26/2018    Procedure: LAPAROSCOPY, SURGICAL; WITH OCCLUSION OF OVIDUCTS BY DEVICE (EG, BAND, CLIP, OR FALOPE RING);  Surgeon: Zachary Dallas Grounds, MD;  Location: [facility] OR [facility];  Service: [facility] Primary Gynecology    PR PELVIC EXAMINATION W ANESTH N/A 05/03/2023    Procedure: PELVIC EXAMINATION UNDER ANESTHESIA (OTHER THAN LOCAL);  Surgeon: Hulen Delon Mascot, MD;  Location: [facility] OR [facility];  Service: Family Planning    WISDOM TOOTH EXTRACTION     [3] No Known Allergies  [4]   Family History  Problem Relation Age of Onset    Diabetes Mother     Hypertension Mother     Kidney disease Mother         diagnosed years ago    Hypertension Father     No Known Problems Sister     No Known Problems Brother     No Known Problems Maternal Aunt     No Known Problems Maternal Uncle     No Known Problems Paternal Aunt     No Known Problems Paternal Uncle     No Known Problems Maternal Grandmother     No Known Problems Maternal Grandfather     No Known Problems Paternal Grandmother     No Known Problems Paternal Grandfather     No Known Problems Other     Anesthesia problems Neg Hx     Broken bones Neg Hx     Cancer Neg Hx     Clotting disorder Neg Hx     Collagen disease Neg Hx     Dislocations Neg Hx     Fibromyalgia Neg Hx     Gout Neg Hx     Hemophilia Neg Hx     Osteoporosis Neg Hx     Rheumatologic disease Neg Hx     Scoliosis Neg Hx     Severe sprains Neg Hx     Sickle cell anemia Neg Hx     Spinal Compression Fracture Neg Hx    [5]   Social History  Socioeconomic History    Marital status: Single     Spouse name: Jamar    Number of children: 3    Years of education: None    Highest education level: None   Occupational History    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     Employer: [employer]   Tobacco Use    Smoking status: Never  Passive exposure: Never    Smokeless tobacco: Never   Vaping Use    Vaping status: Never Used   Substance and Sexual Activity    Alcohol use: Yes     Comment: 2-3    Drug use: No    Sexual activity: Yes     Partners: Male     Birth control/protection: None     Social Drivers of Health     Food Insecurity: No Food Insecurity (04/19/2024)    Hunger Vital Sign     Worried About Running Out of Food in the Last Year: Never true     Ran Out of Food in the Last Year: Never true   Tobacco Use: Low Risk (04/29/2024)    Patient History     Smoking Tobacco Use: Never     Smokeless Tobacco Use: Never     Passive Exposure: Never   Transportation Needs: No Transportation Needs (04/19/2024)    PRAPARE - Transportation     Lack of Transportation (Medical): No     Lack of Transportation (Non-Medical): No   Housing: Low Risk (04/19/2024)    Housing     Within the past 12 months, have you ever stayed: outside, in a car, in a tent, in an overnight shelter, or temporarily in someone else's home (i.e. couch-surfing)?: No     Are you worried about losing your housing?: No   Utilities: Low Risk (04/19/2024)    Utilities     Within the past 12 months, have you been unable to get utilities (heat, electricity) when it was really needed?: No   Interpersonal Safety: Not At Risk (04/19/2024)    Interpersonal Safety     Unsafe Where You Currently Live: No     Physically Hurt by Anyone: No     Abused by Anyone: No   Financial Resource Strain: Low Risk (04/19/2024)    Overall Financial Resource Strain (CARDIA)     Difficulty of Paying Living Expenses: Not very hard   Internet Connectivity: Internet connectivity concern unknown (04/19/2024)    Internet Connectivity     Do you have access to internet services: Yes

## 2024-04-29 NOTE — Patient Instructions (Addendum)
 You were seen today for muscle spasm of your upper back.  A muscle relaxer called cyclobenzaprine  has been prescribed to help treat this.  Be aware that cyclobenzaprine  can cause sedation so you should not use this medication if you are going to be driving or operating other heavy machinery. Also avoid taking this medication with other sedating medications or alcohol. In addition of this muscle relaxer you can use over-the-counter pain relieving medication such as Tylenol  or ibuprofen  if you tolerate those medications well.  Application of heat to the involved part of the back may help symptomatically and you can also use over-the-counter pain relieving medications like Salonpas lidocaine  patches.  Please be evaluated again for new or worsening symptoms.

## 2024-05-22 NOTE — Assessment & Plan Note (Signed)
 Currently taking lisinopril  20 mg. Her hydrochlorothiazide  was discontinued due to low pressures in the past per her understanding. Was started on phentermine  for weight management about two months ago (during this time she has lost 20 lbs) and has been monitoring her BP at home with several days her diastolic is in the 90s but normal systolic. Today in clinic her BP is 117/87 but she has not taken her phentermine  in 3 days. In reviewing her BP trend overall her BP has trended down with wieght loss (has lost 20 lbs!).     Discussed option to increase in lisinopril  to 40 today vs monitoring and suspect BP will continue to improve with weight loss.     Plan:  - continue lisinopril  20 mg- no changes today  - continue to monitor BP at home  - follow up in one month and if BP still elevated consider increasing lisinopril  dose
